# Patient Record
Sex: Female | Born: 1960
Health system: Southern US, Community
[De-identification: ages and names within clinical notes are randomized; demographics above are authoritative.]

## PROBLEM LIST (undated history)

## (undated) DIAGNOSIS — M1990 Unspecified osteoarthritis, unspecified site: Secondary | ICD-10-CM

## (undated) DIAGNOSIS — M8580 Other specified disorders of bone density and structure, unspecified site: Secondary | ICD-10-CM

## (undated) HISTORY — DX: Other specified disorders of bone density and structure, unspecified site: M85.80

## (undated) HISTORY — DX: Unspecified osteoarthritis, unspecified site: M19.90

## (undated) HISTORY — PX: DILATION AND CURETTAGE OF UTERUS: SHX78

---

## 1998-02-24 ENCOUNTER — Other Ambulatory Visit: Admission: RE | Admit: 1998-02-24 | Discharge: 1998-02-24 | Payer: Self-pay | Admitting: Gynecology

## 1999-04-18 ENCOUNTER — Other Ambulatory Visit: Admission: RE | Admit: 1999-04-18 | Discharge: 1999-04-18 | Payer: Self-pay | Admitting: Internal Medicine

## 2000-08-12 ENCOUNTER — Other Ambulatory Visit: Admission: RE | Admit: 2000-08-12 | Discharge: 2000-08-12 | Payer: Self-pay | Admitting: Gynecology

## 2001-11-16 ENCOUNTER — Other Ambulatory Visit: Admission: RE | Admit: 2001-11-16 | Discharge: 2001-11-16 | Payer: Self-pay | Admitting: Gynecology

## 2002-11-30 ENCOUNTER — Other Ambulatory Visit: Admission: RE | Admit: 2002-11-30 | Discharge: 2002-11-30 | Payer: Self-pay | Admitting: Gynecology

## 2002-11-30 ENCOUNTER — Ambulatory Visit (HOSPITAL_COMMUNITY): Admission: RE | Admit: 2002-11-30 | Discharge: 2002-11-30 | Payer: Self-pay | Admitting: Gynecology

## 2004-01-12 ENCOUNTER — Other Ambulatory Visit: Admission: RE | Admit: 2004-01-12 | Discharge: 2004-01-12 | Payer: Self-pay | Admitting: Gynecology

## 2005-01-16 ENCOUNTER — Other Ambulatory Visit: Admission: RE | Admit: 2005-01-16 | Discharge: 2005-01-16 | Payer: Self-pay | Admitting: Gynecology

## 2006-01-23 ENCOUNTER — Other Ambulatory Visit: Admission: RE | Admit: 2006-01-23 | Discharge: 2006-01-23 | Payer: Self-pay | Admitting: Gynecology

## 2007-02-20 ENCOUNTER — Other Ambulatory Visit: Admission: RE | Admit: 2007-02-20 | Discharge: 2007-02-20 | Payer: Self-pay | Admitting: Gynecology

## 2007-11-20 ENCOUNTER — Ambulatory Visit: Payer: Self-pay | Admitting: Gynecology

## 2007-11-24 ENCOUNTER — Ambulatory Visit: Payer: Self-pay | Admitting: Gynecology

## 2007-12-11 ENCOUNTER — Ambulatory Visit: Payer: Self-pay | Admitting: Gynecology

## 2007-12-15 ENCOUNTER — Ambulatory Visit: Payer: Self-pay | Admitting: Gynecology

## 2007-12-16 ENCOUNTER — Ambulatory Visit: Payer: Self-pay | Admitting: Gynecology

## 2007-12-16 ENCOUNTER — Ambulatory Visit (HOSPITAL_BASED_OUTPATIENT_CLINIC_OR_DEPARTMENT_OTHER): Admission: RE | Admit: 2007-12-16 | Discharge: 2007-12-16 | Payer: Self-pay | Admitting: Gynecology

## 2007-12-16 ENCOUNTER — Encounter: Payer: Self-pay | Admitting: Gynecology

## 2007-12-16 HISTORY — PX: OTHER SURGICAL HISTORY: SHX169

## 2007-12-24 ENCOUNTER — Ambulatory Visit: Payer: Self-pay | Admitting: Gynecology

## 2007-12-30 ENCOUNTER — Ambulatory Visit: Payer: Self-pay | Admitting: Gynecology

## 2008-04-26 ENCOUNTER — Encounter: Payer: Self-pay | Admitting: Gynecology

## 2008-04-26 ENCOUNTER — Ambulatory Visit: Payer: Self-pay | Admitting: Gynecology

## 2008-04-26 ENCOUNTER — Other Ambulatory Visit: Admission: RE | Admit: 2008-04-26 | Discharge: 2008-04-26 | Payer: Self-pay | Admitting: Gynecology

## 2008-05-10 ENCOUNTER — Ambulatory Visit: Payer: Self-pay | Admitting: Gynecology

## 2008-05-20 ENCOUNTER — Ambulatory Visit: Payer: Self-pay | Admitting: Gynecology

## 2008-05-25 ENCOUNTER — Ambulatory Visit: Payer: Self-pay | Admitting: Gynecology

## 2009-03-29 ENCOUNTER — Encounter: Admission: RE | Admit: 2009-03-29 | Discharge: 2009-03-29 | Payer: Self-pay | Admitting: Gynecology

## 2009-05-31 ENCOUNTER — Ambulatory Visit: Payer: Self-pay | Admitting: Gynecology

## 2009-05-31 ENCOUNTER — Other Ambulatory Visit: Admission: RE | Admit: 2009-05-31 | Discharge: 2009-05-31 | Payer: Self-pay | Admitting: Gynecology

## 2010-06-05 NOTE — Op Note (Signed)
[name], [name]              ACCOUNT NO.:  [id number]   MEDICAL RECORD NO.:  [id number]          PATIENT TYPE:  AMB   LOCATION:  [facility]                         FACILITY:  [facility]   PHYSICIAN:  Juan H. Lily Peer, M.D.DATE OF BIRTH:  1960-01-31   DATE OF PROCEDURE:  12/16/2007  DATE OF DISCHARGE:                               OPERATIVE REPORT   INDICATIONS FOR OPERATION:  A 50 year old gravida 4, para 3, AB1 with  dysfunctional uterine bleeding, workup gad consisted of sonohysterogram  demonstrated multiple endometrial polyps, endometrial biopsies otherwise  had been benign.   PREOPERATIVE DIAGNOSES:  1. Dysfunctional uterine bleeding.  2. Endometrial polyps.   POSTOPERATIVE DIAGNOSES:  1. Dysfunctional uterine bleeding.  2. Endometrial polyps.   ANESTHESIA:  General endotracheal anesthesia.   PROCEDURE:  Resectoscopic polypectomy.   FINDINGS:  Multiple endometrial polyps scattered throughout the  intrauterine cavity.  The cervical canal was clear.  Both tubal ostia  were identified.   DESCRIPTION OF OPERATION:  After the patient was adequately counseled  she was taken to the operating room where she underwent a successful  general endotracheal anesthesia.  She had a laminaria, had previously  been placed intracervically the day before the surgery which was  removed.  The cervix and vagina were prepped and draped in the usual  sterile fashion.  Single tooth tenaculum was placed on the anterior  cervical lip.  The uterus sounded to approximately 7 cm.  The cervix did  not require any dilatation.  The Lendell Caprice operative resectoscope with  a 90 degrees, wire loop was introduced into the intrauterine cavity.  The [facility] surgical generator was set at 30 on  coagulation and 70 on cut, 3% sorbitol was the distending media and the  entire endometrial cavity was inspected and multiple passes with the  operative resectoscope was required to remove the numerous  endometrial  polyps and was submitted for histological evaluation due to this small  bleeding, bleeders that were present throughout the cavity for  containment the rollerball was placed and these areas were cauterized to  contain the bleeding.  The patient tolerated the procedure well, single-  tooth tenaculum was removed.  Pre and post resectoscopic polypectomy  pictures were obtained, a set of copy can be kept at patient's hospital  record a second set will be kept in the office [facility].  The patient was awakened, extubated, and transferred to recovery with  stable vital signs.  Blood loss was minimal and not fluid resuscitation  consisted 900 mL of lactated Ringer's and urine  output preoperatively was 50 mL in and out __________ 3% sorbitol  distending media.  Fluid deficit was 200 mL and she did receive 1 gram  of cefoxitin for prophylaxis and had __________stockings at the knee  level for DVT prophylaxis.  The patient was awakened, transferred to  recovery in stable vital signs.      Juan H. Lily Peer, M.D.  Electronically Signed     JHF/MEDQ  D:  12/16/2007  T:  12/16/2007  Job:  [id number]

## 2010-06-05 NOTE — H&P (Signed)
[name], [name]              ACCOUNT NO.:  [id number]   MEDICAL RECORD NO.:  [id number]          PATIENT TYPE:  AMB   LOCATION:  [facility]                         [facility]ACILITY:  [facility]   PHYSICIAN:  Juan H. Lily Peer, M.D.DATE OF BIRTH:  [birth year]   DATE OF ADMISSION:  DATE OF DISCHARGE:                              HISTORY & PHYSICAL   CHIEF COMPLAINT:  1. Dysfunctional uterine bleeding.  2. Endometrial polyps.   HISTORY:  The patient is a 50 year old gravida 4, para 3, AB 1, whose  husband has had a vasectomy, was seen in the office on several occasions  this year complaining of dysfunctional uterine bleeding since October  this year.  Her workup had consisted of a normal TSH, prolactin.  She  was found to have what appeared to be iron deficiency anemia as a result  of her irregular bleeding.  She had had a CBC back in October 30  whereby, her hemoglobin was 8.4, hematocrit 27.3, and platelet count  378,000.  She is placed on iron supplementation twice a day and her iron  level on November 20 was as follows; hemoglobin 10.1, hematocrit 32.6,  and platelet count 332,000.  She had been placed on Megace 40 mg b.i.d.  to stop her bleeding.  Her workup had consisted of an endometrial biopsy  which was done on October 30 with a proliferative type endometrium.  No  evidence of hyperplasia or malignancy was identified, and she had had a  sonohysterogram on November 3 which demonstrated that she had 3  endometrial polyps in the uterus measuring 7 mm, 5 mm, and 14 mm x 7 mm.  She had a right atrophic ovary and left ovary had several follicles and  appears that may be she may have a small hydrosalpinx on the left tube.  The patient scheduled to undergo endometrial ablation, and she was seen  in the office for preoperative consultation on November 24 and had a  laminaria placed intracervically.   PAST MEDICAL HISTORY:  The patient denies any allergies.  She has had 3  normal spontaneous  vaginal delivery.  One missed AB resulting in a D and  C.  Her husband has had a vasectomy.  She takes calcium with vitamin D  for osteoporosis prevention.   FAMILY HISTORY:  Father with cardiovascular disease.  Mother with  osteoporosis and mother with thyroid disease.   PHYSICAL EXAMINATION:  VITAL SIGNS:  The patient weighs 156 pounds.  She  is 5 feet 5 inches tall.  Blood pressure 116/72.  HEENT:  Unremarkable.  NECK:  Supple.  Trachea midline.  No carotid bruits.  No thyromegaly.  LUNGS:  Clear to auscultation without rhonchi or wheezing.  HEART:  Regular rate and rhythm.  No murmurs or gallops.  BREASTS:  Exam done at time of her annual exam does show she was normal.  ABDOMEN:  Soft, nontender without rebound or guarding.  PELVIC:  Bartholin, urethra, Skene's within normal limits.  Vagina and  cervix no lesions or discharge.  Uterus anteverted, normal size, shape  and consistency.  Adnexa without any mass or tenderness.  RECTAL:  Deferred.   ASSESSMENT:  This 50 year old gravida 4, para 3, AB one ( husband with  vasectomy) with complaint of dysfunctional uterine bleeding and iron-  deficiency anemia.  The patient's hemoglobin improved on iron b.i.d. to  the most recent CBC done in the office on November 20, whereby her  hemoglobin was 10.1 and hematocrit 32.6.  Her TSH and prolactin have  been normal.  Normal endometrial biopsy and a sonohysterogram  demonstrated several intrauterine polyps.  The patient was seen in the  office on November 24.  A laminaria was placed intracervically.  She was  counseled of risks, benefits, and pros and cons of the operation to  include infection although she will receive prophylaxis antibiotic.  The  risk of uterine perforation was discussed as well and in the event of  hemorrhage, she would need any blood or blood products, potential risk  of anaphylactic reactions, hepatitis, and AIDS from donor blood with  discussed as well.  All questions  were answered, and we will follow  accordingly.   PLAN:  The patient was scheduled for resectoscope polypectomy on  Wednesday, November 25, at 8:30 a.m. in [facility].  Please have history and physical available.      Juan H. Lily Peer, M.D.  Electronically Signed     JHF/MEDQ  D:  12/15/2007  T:  12/16/2007  Job:  [id number]

## 2010-06-11 ENCOUNTER — Other Ambulatory Visit: Payer: Self-pay | Admitting: Gynecology

## 2010-06-11 DIAGNOSIS — Z1231 Encounter for screening mammogram for malignant neoplasm of breast: Secondary | ICD-10-CM

## 2010-06-25 ENCOUNTER — Ambulatory Visit
Admission: RE | Admit: 2010-06-25 | Discharge: 2010-06-25 | Disposition: A | Discharge status: Home / Self Care | Payer: 59 | Source: Ambulatory Visit | Attending: Gynecology | Admitting: Gynecology

## 2010-06-25 DIAGNOSIS — Z1231 Encounter for screening mammogram for malignant neoplasm of breast: Secondary | ICD-10-CM

## 2010-10-31 ENCOUNTER — Encounter: Payer: 59 | Admitting: Gynecology

## 2010-11-05 ENCOUNTER — Encounter: Payer: 59 | Admitting: Gynecology

## 2010-11-26 ENCOUNTER — Encounter: Payer: Self-pay | Admitting: Gynecology

## 2010-11-26 ENCOUNTER — Ambulatory Visit (INDEPENDENT_AMBULATORY_CARE_PROVIDER_SITE_OTHER): Payer: 59 | Admitting: Gynecology

## 2010-11-26 ENCOUNTER — Other Ambulatory Visit (HOSPITAL_COMMUNITY)
Admission: RE | Admit: 2010-11-26 | Discharge: 2010-11-26 | Disposition: A | Discharge status: Home / Self Care | Payer: 59 | Source: Ambulatory Visit | Attending: Gynecology | Admitting: Gynecology

## 2010-11-26 VITALS — BP 116/70 | Ht 64.5 in | Wt 161.0 lb

## 2010-11-26 DIAGNOSIS — Z01419 Encounter for gynecological examination (general) (routine) without abnormal findings: Secondary | ICD-10-CM | POA: Insufficient documentation

## 2010-11-26 DIAGNOSIS — R82998 Other abnormal findings in urine: Secondary | ICD-10-CM

## 2010-11-26 DIAGNOSIS — N951 Menopausal and female climacteric states: Secondary | ICD-10-CM

## 2010-11-26 DIAGNOSIS — R635 Abnormal weight gain: Secondary | ICD-10-CM

## 2010-11-26 NOTE — Progress Notes (Signed)
[name] 10-Jan-1961 [id number]   History:    50 y.o.  for annual exam with complaints of vasomotor symptoms consisting of hot flashes irritability and mood swing and vaginal dryness. Patient's husband has had a vasectomy. Patient's last mammogram was in May of this year which was normal. Review of her record indicates she was weighing 166 down to 161. Back in 2008 she had a bone density study as a result of the history of stress fracture and family history of osteoporosis and she had normal bone mineralization.  Past medical history,surgical history, family history and social history were all reviewed and documented in the EPIC chart.  Gynecologic History Patient's last menstrual period was 05/01/2010. Contraception: vasectomy Last Pap: 2011. Results were: normal Last mammogram: 2012. Results were: normal  Obstetric History OB History    Grav Para Term Preterm Abortions TAB SAB Ect Mult Living   4 3 3  1  1   3      # Outc Date GA Lbr Len/2nd Wgt Sex Del Anes PTL Lv   1 TRM     F SVD   Yes   2 TRM     F SVD   Yes   3 TRM     M SVD   Yes   4 SAB                ROS:  Was performed and pertinent positives and negatives are included in the history.  Exam: chaperone present  BP 116/70  Ht 5' 4.5" (1.638 m)  Wt 161 lb (73.029 kg)  BMI 27.21 kg/m2  LMP 05/01/2010  Body mass index is 27.21 kg/(m^2).  General appearance : Well developed well nourished female. No acute distress HEENT: Neck supple, trachea midline, no carotid bruits, no thyroidmegaly Lungs: Clear to auscultation, no rhonchi or wheezes, or rib retractions  Heart: Regular rate and rhythm, no murmurs or gallops Breast:Examined in sitting and supine position were symmetrical in appearance, no palpable masses or tenderness,  no skin retraction, no nipple inversion, no nipple discharge, no skin discoloration, no axillary or supraclavicular lymphadenopathy Abdomen: no palpable masses or tenderness, no rebound or  guarding Extremities: no edema or skin discoloration or tenderness  Pelvic:  Bartholin, Urethra, Skene Glands: Within normal limits             Vagina: No gross lesions or discharge  Cervix: No gross lesions or discharge  Uterus  anteverted, normal size, shape and consistency, non-tender and mobile  Adnexa  Without masses or tenderness  Anus and perineum  normal   Rectovaginal  normal sphincter tone without palpated masses or tenderness             Hemoccult not done     Assessment/Plan:  50 y.o. female for annual exam with climacteric like symptoms consisting of hot flashes irritability and mood swings and vaginal dryness. She has had 2 menstrual periods in the past 2 years. She was encouraged to do her monthly self breast examination. We went over the course of the appropriate amount of calcium and vitamin D for osteoporosis prevention. Also recommend weightbearing exercises we 5 minutes 3-4 times a week. We will plan on doing a bone density study next year. She'll return back to the office next week to discuss the following lab results: Low sugar, TSH, CBC, total cholesterol, UA, and Pap smear. Literature information on the menopause was provided as well as on hormone replacement therapy which we will discuss six-week as well as a  result of her FSH. Patient received her flu vaccine October of this year.    Ok Edwards MD, 10:43 AM 11/26/2010

## 2010-11-26 NOTE — Patient Instructions (Signed)
Total Calcium recommended is 1200-1500mg /day and of vitamin D should be 2000 units/day

## 2010-12-04 ENCOUNTER — Ambulatory Visit (INDEPENDENT_AMBULATORY_CARE_PROVIDER_SITE_OTHER): Payer: 59 | Admitting: Gynecology

## 2010-12-04 ENCOUNTER — Encounter: Payer: Self-pay | Admitting: Gynecology

## 2010-12-04 VITALS — BP 120/74

## 2010-12-04 DIAGNOSIS — Z780 Asymptomatic menopausal state: Secondary | ICD-10-CM

## 2010-12-04 DIAGNOSIS — N951 Menopausal and female climacteric states: Secondary | ICD-10-CM

## 2010-12-04 MED ORDER — PROGESTERONE MICRONIZED 200 MG PO CAPS: 200.0000 mg | ORAL_CAPSULE | Freq: Every day | ORAL | Status: DC

## 2010-12-04 MED ORDER — ESTRADIOL 0.52 MG/0.87 GM (0.06%) TD GEL: 1.0000 "application " | Freq: Every day | TRANSDERMAL | Status: DC

## 2010-12-04 NOTE — Patient Instructions (Signed)
NAMS Financial planner Menopausal Society)   [email or web address]

## 2010-12-04 NOTE — Progress Notes (Signed)
Patient is a 50 year old who was seen in the office for her annual gynecological examination on November 5 with vasomotor symptoms consisting of hot flashes, ureter bili, mood swings, and vaginal dryness. She returned to the office today to discuss lab results and plan a course of management. Lab results demonstrated a negative Pap smear and normal CBC, urinalysis, cholesterol, and blood sugar, TSH. Her FSH was found to be elevated at 46 (menopausal range)  Patient has had only 2 periods in the past year. We had a lengthy discussion of the women's health initiative study as to the risk of hormone replacement therapy. The risks benefits and pros and cons were discussed to include the risk of DVT as well as the risk of breast cancer and other issues. Patient previously was provided literature formation on hormone replacement therapy and on the menopause. She had read the literature formation was provided all questions were discussed today. We discussed about starting with the lowest dose and plan on 5-6 years and then begin to taper her off. And due to the fact that she has the uterus and we will need to place her on a progestational agent but cyclically.  We discussed oral versus transdermal rounds and she would like to proceed with a transdermal route we'll start her on elestrin 0.06% to apply one pump to one arm only daily with the addition of Prometrium 200 mg for the first 12 days of each month.  Patient fully understands the risks benefits and pros and cons of hormone replacement therapy and accepts and she will we'll start on the above recommendation.. If she has any form of irregular bleeding she knows to contact the office whereby she will need to be scheduled for an endometrial biopsy.

## 2011-04-22 ENCOUNTER — Other Ambulatory Visit: Payer: Self-pay | Admitting: *Deleted

## 2011-04-22 MED ORDER — ESTRADIOL 0.52 MG/0.87 GM (0.06%) TD GEL: 1.0000 "application " | Freq: Every day | TRANSDERMAL | Status: DC

## 2011-10-17 ENCOUNTER — Other Ambulatory Visit: Payer: Self-pay | Admitting: Gynecology

## 2011-10-17 DIAGNOSIS — Z1231 Encounter for screening mammogram for malignant neoplasm of breast: Secondary | ICD-10-CM

## 2011-10-24 ENCOUNTER — Ambulatory Visit
Admission: RE | Admit: 2011-10-24 | Discharge: 2011-10-24 | Disposition: A | Discharge status: Home / Self Care | Payer: 59 | Source: Ambulatory Visit | Attending: Gynecology | Admitting: Gynecology

## 2011-10-24 DIAGNOSIS — Z1231 Encounter for screening mammogram for malignant neoplasm of breast: Secondary | ICD-10-CM

## 2011-12-16 ENCOUNTER — Other Ambulatory Visit: Payer: Self-pay | Admitting: Gynecology

## 2011-12-16 ENCOUNTER — Encounter: Payer: Self-pay | Admitting: Gynecology

## 2011-12-16 ENCOUNTER — Ambulatory Visit (INDEPENDENT_AMBULATORY_CARE_PROVIDER_SITE_OTHER): Payer: 59 | Admitting: Gynecology

## 2011-12-16 VITALS — BP 120/80 | Ht 64.5 in | Wt 157.0 lb

## 2011-12-16 DIAGNOSIS — Z79890 Hormone replacement therapy (postmenopausal): Secondary | ICD-10-CM

## 2011-12-16 DIAGNOSIS — N951 Menopausal and female climacteric states: Secondary | ICD-10-CM

## 2011-12-16 DIAGNOSIS — Z01419 Encounter for gynecological examination (general) (routine) without abnormal findings: Secondary | ICD-10-CM

## 2011-12-16 LAB — GLUCOSE, RANDOM: Glucose, Bld: 83 mg/dL (ref 70–99)

## 2011-12-16 LAB — CBC WITH DIFFERENTIAL/PLATELET
Basophils Relative: 0 % (ref 0–1)
Eosinophils Absolute: 0 10*3/uL (ref 0.0–0.7)
Eosinophils Relative: 0 % (ref 0–5)
HCT: 38 % (ref 36.0–46.0)
Lymphocytes Relative: 41 % (ref 12–46)
MCH: 32.2 pg (ref 26.0–34.0)
MCHC: 33.7 g/dL (ref 30.0–36.0)
MCV: 95.7 fL (ref 78.0–100.0)
Monocytes Absolute: 0.4 10*3/uL (ref 0.1–1.0)
Neutrophils Relative %: 51 % (ref 43–77)
RDW: 13.6 % (ref 11.5–15.5)

## 2011-12-16 LAB — CHOLESTEROL, TOTAL: Cholesterol: 195 mg/dL (ref 0–200)

## 2011-12-16 LAB — TSH: TSH: 0.7 u[IU]/mL (ref 0.350–4.500)

## 2011-12-16 NOTE — Patient Instructions (Addendum)
Breast Self-Awareness Practicing breast self-awareness may pick up problems early, prevent significant medical complications, and possibly save your life. By practicing breast self-awareness, you can become familiar with how your breasts look and feel and if your breasts are changing. This allows you to notice changes early. It can also offer you some reassurance that your breast health is good. One way to learn what is normal for your breasts and whether your breasts are changing is to do a breast self-exam. If you find a lump or something that was not present in the past, it is best to contact your caregiver right away. Other findings that should be evaluated by your caregiver include nipple discharge, especially if it is bloody; skin changes or reddening; areas where the skin seems to be pulled in (retracted); or new lumps and bumps. Breast pain is seldom associated with cancer (malignancy), but should also be evaluated by a caregiver. BREAST SELF-EXAM The best time to examine your breasts is 5 7 days after your menstrual period is over. During menstruation, the breasts are lumpier, and it may be more difficult to pick up changes. If you do not menstruate, have reached menopause, or had your uterus removed (hysterectomy), you should examine your breasts at regular intervals, such as monthly. If you are breastfeeding, examine your breasts after a feeding or after using a breast pump. Breast implants do not decrease the risk for lumps or tumors, so continue to perform breast self-exams as recommended. Talk to your caregiver about how to determine the difference between the implant and breast tissue. Also, talk about the amount of pressure you should use during the exam. Over time, you will become more familiar with the variations of your breasts and more comfortable with the exam. A breast self-exam requires you to remove all your clothes above the waist.   Look at your breasts and nipples. Stand in front of  a mirror in a room with good lighting. With your hands on your hips, push your hands firmly downward. Look for a difference in shape, contour, and size from one breast to the other (asymmetry). Asymmetry includes puckers, dips, or bumps. Also, look for skin changes, such as reddened or scaly areas on the breasts. Look for nipple changes, such as discharge, dimpling, repositioning, or redness.  Carefully feel your breasts. This is best done either in the shower or tub while using soapy water or when flat on your back. Place the arm (on the side of the breast you are examining) above your head. Use the pads (not the fingertips) of your three middle fingers on your opposite hand to feel your breasts. Start in the underarm area and use  inch (2 cm) overlapping circles to feel your breast. Use 3 different levels of pressure (light, medium, and firm pressure) at each circle before moving to the next circle. The light pressure is needed to feel the tissue closest to the skin. The medium pressure will help to feel breast tissue a little deeper, while the firm pressure is needed to feel the tissue close to the ribs. Continue the overlapping circles, moving downward over the breast until you feel your ribs below your breast. Then, move one finger-width towards the center of the body. Continue to use the  inch (2 cm) overlapping circles to feel your breast as you move slowly up toward the collar bone (clavicle) near the base of the neck. Continue the up and down exam using all 3 pressures until you reach the middle of   the chest. Do this with each breast, carefully feeling for lumps or changes.  Keep a written record with breast changes or normal findings for each breast. By writing this information down, you do not need to depend only on memory for size, tenderness, or location. Write down where you are in your menstrual cycle, if you are still menstruating.  Breast tissue can have some lumps or thick tissue. However,  see your caregiver if you find anything that concerns you.  SEEK MEDICAL CARE IF:  You see a change in shape, contour, or size of your breasts or nipples.   You see skin changes, such as reddened or scaly areas on the breasts or nipples.   You have an unusual discharge from your nipples.   You feel a new lump or unusually thick areas.  Document Released: 01/07/2005 Document Revised: 07/09/2011 Document Reviewed: 04/24/2011 ExitCare Patient Information 2013 [employer].   Tetanus, Diphtheria, Pertussis (Tdap) Vaccine What You Need to Know WHY GET VACCINATED? Tetanus, diphtheria and pertussis can be very serious diseases, even for adolescents and adults. Tdap vaccine can protect us from these diseases. TETANUS (Lockjaw) causes painful muscle tightening and stiffness, usually all over the body.  It can lead to tightening of muscles in the head and neck so you can't open your mouth, swallow, or sometimes even breathe. Tetanus kills about 1 out of 5 people who are infected. DIPHTHERIA can cause a thick coating to form in the back of the throat.  It can lead to breathing problems, paralysis, heart failure, and death. PERTUSSIS (Whooping Cough) causes severe coughing spells, which can cause difficulty breathing, vomiting and disturbed sleep.  It can also lead to weight loss, incontinence, and rib fractures. Up to 2 in 100 adolescents and 5 in 100 adults with pertussis are hospitalized or have complications, which could include pneumonia and death. These diseases are caused by bacteria. Diphtheria and pertussis are spread from person to person through coughing or sneezing. Tetanus enters the body through cuts, scratches, or wounds. Before vaccines, the [place] saw as many as 200,000 cases a year of diphtheria and pertussis, and hundreds of cases of tetanus. Since vaccination began, tetanus and diphtheria have dropped by about 99% and pertussis by about 80%. TDAP VACCINE Tdap  vaccine can protect adolescents and adults from tetanus, diphtheria, and pertussis. One dose of Tdap is routinely given at age 11 or 12. People who did not get Tdap at that age should get it as soon as possible. Tdap is especially important for health care professionals and anyone having close contact with a baby younger than 12 months. Pregnant women should get a dose of Tdap during every pregnancy, to protect the newborn from pertussis. Infants are most at risk for severe, life-threatening complications from pertussis. A similar vaccine, called Td, protects from tetanus and diphtheria, but not pertussis. A Td booster should be given every 10 years. Tdap may be given as one of these boosters if you have not already gotten a dose. Tdap may also be given after a severe cut or burn to prevent tetanus infection. Your doctor can give you more information. Tdap may safely be given at the same time as other vaccines. SOME PEOPLE SHOULD NOT GET THIS VACCINE  If you ever had a life-threatening allergic reaction after a dose of any tetanus, diphtheria, or pertussis containing vaccine, OR if you have a severe allergy to any part of this vaccine, you should not get Tdap. Tell your doctor if you   have any severe allergies.  If you had a coma, or long or multiple seizures within 7 days after a childhood dose of DTP or DTaP, you should not get Tdap, unless a cause other than the vaccine was found. You can still get Td.  Talk to your doctor if you:  have epilepsy or another nervous system problem,  had severe pain or swelling after any vaccine containing diphtheria, tetanus or pertussis,  ever had Guillain-Barr Syndrome (GBS),  aren't feeling well on the day the shot is scheduled. RISKS OF A VACCINE REACTION With any medicine, including vaccines, there is a chance of side effects. These are usually mild and go away on their own, but serious reactions are also possible. Brief fainting spells can follow a  vaccination, leading to injuries from falling. Sitting or lying down for about 15 minutes can help prevent these. Tell your doctor if you feel dizzy or light-headed, or have vision changes or ringing in the ears. Mild problems following Tdap (Did not interfere with activities)  Pain where the shot was given (about 3 in 4 adolescents or 2 in 3 adults)  Redness or swelling where the shot was given (about 1 person in 5)  Mild fever of at least 100.4F (up to about 1 in 25 adolescents or 1 in 100 adults)  Headache (about 3 or 4 people in 10)  Tiredness (about 1 person in 3 or 4)  Nausea, vomiting, diarrhea, stomach ache (up to 1 in 4 adolescents or 1 in 10 adults)  Chills, body aches, sore joints, rash, swollen glands (uncommon) Moderate problems following Tdap (Interfered with activities, but did not require medical attention)  Pain where the shot was given (about 1 in 5 adolescents or 1 in 100 adults)  Redness or swelling where the shot was given (up to about 1 in 16 adolescents or 1 in 25 adults)  Fever over 102F (about 1 in 100 adolescents or 1 in 250 adults)  Headache (about 3 in 20 adolescents or 1 in 10 adults)  Nausea, vomiting, diarrhea, stomach ache (up to 1 or 3 people in 100)  Swelling of the entire arm where the shot was given (up to about 3 in 100). Severe problems following Tdap (Unable to perform usual activities, required medical attention)  Swelling, severe pain, bleeding and redness in the arm where the shot was given (rare). A severe allergic reaction could occur after any vaccine (estimated less than 1 in a million doses). WHAT IF THERE IS A SERIOUS REACTION? What should I look for?  Look for anything that concerns you, such as signs of a severe allergic reaction, very high fever, or behavior changes. Signs of a severe allergic reaction can include hives, swelling of the face and throat, difficulty breathing, a fast heartbeat, dizziness, and weakness. These  would start a few minutes to a few hours after the vaccination. What should I do?  If you think it is a severe allergic reaction or other emergency that can't wait, call 9-1-1 or get the person to the nearest hospital. Otherwise, call your doctor.  Afterward, the reaction should be reported to the "Vaccine Adverse Event Reporting System" (VAERS). Your doctor might file this report, or you can do it yourself through the VAERS web site at www.vaers.hhs.gov, or by calling 1-800-822-7967. VAERS is only for reporting reactions. They do not give medical advice.  THE NATIONAL VACCINE INJURY COMPENSATION PROGRAM The National Vaccine Injury Compensation Program (VICP) is a federal program that was created to   compensate people who may have been injured by certain vaccines. Persons who believe they may have been injured by a vaccine can learn about the program and about filing a claim by calling 1-800-338-2382 or visiting the VICP website at www.hrsa.gov/vaccinecompensation. HOW CAN I LEARN MORE?  Ask your doctor.  Call your local or state health department.  Contact the Centers for Disease Control and Prevention (CDC):  Call 1-800-232-4636 or visit CDC's website at www.cdc.gov/vaccines CDC Tdap Vaccine VIS (05/30/11) Document Released: 07/09/2011 Document Reviewed: 07/09/2011 ExitCare Patient Information 2013 [employer].   

## 2011-12-16 NOTE — Progress Notes (Signed)
[name] 1960-07-24 [id number]   History:    51 y.o.  for annual gyn exam with no complaints. Her husband has had a vasectomy. She was diagnosed been menopausal last year. She was started on elestrin 0.06% which she applies transdermally to one arm each bedtime with the addition of Prometrium 200 mg for 10 days of the month. She denies any vaginal bleeding and her vasomotor symptoms have improved significantly. She infrequent does her self breast examination. Her last mammogram was October this year which was normal. Her last Pap smear was normal in 2012. No prior history of abnormal Pap smears. She is taking her calcium and vitamin D. Her mother and grandmother both had osteoporosis. Patient's last bone density study was here in our office in 2008 which was normal and was done because also patient has had history of stress fractures in the past.  Past medical history,surgical history, family history and social history were all reviewed and documented in the EPIC chart.  Gynecologic History No LMP recorded. Patient is not currently having periods (Reason: Perimenopausal). Contraception: post menopausal status Last Pap: 2012. Results were: normal Last mammogram: 2013. Results were: normal  Obstetric History OB History    Grav Para Term Preterm Abortions TAB SAB Ect Mult Living   4 3 3  1  1   3      # Outc Date GA Lbr Len/2nd Wgt Sex Del Anes PTL Lv   1 TRM     F SVD   Yes   2 TRM     F SVD   Yes   3 TRM     M SVD   Yes   4 SAB                ROS: A ROS was performed and pertinent positives and negatives are included in the history.  GENERAL: No fevers or chills. HEENT: No change in vision, no earache, sore throat or sinus congestion. She was noted to have a pigmented lesion on her bottom lip. Also the right maxillary bone laterally there appears to be a raised pigmented area possibly senile keratosis. NECK: No pain or stiffness. CARDIOVASCULAR: No chest pain or pressure. No  palpitations. PULMONARY: No shortness of breath, cough or wheeze. GASTROINTESTINAL: No abdominal pain, nausea, vomiting or diarrhea, melena or bright red blood per rectum. GENITOURINARY: No urinary frequency, urgency, hesitancy or dysuria. MUSCULOSKELETAL: No joint or muscle pain, no back pain, no recent trauma. DERMATOLOGIC: No rash, no itching, no lesions. ENDOCRINE: No polyuria, polydipsia, no heat or cold intolerance. No recent change in weight. HEMATOLOGICAL: No anemia or easy bruising or bleeding. NEUROLOGIC: No headache, seizures, numbness, tingling or weakness. PSYCHIATRIC: No depression, no loss of interest in normal activity or change in sleep pattern.     Exam: chaperone present  BP 120/80  Ht 5' 4.5" (1.638 m)  Wt 157 lb (71.215 kg)  BMI 26.53 kg/m2  Body mass index is 26.53 kg/(m^2).  General appearance : Well developed well nourished female. No acute distress HEENT: Neck supple, trachea midline, no carotid bruits, no thyroidmegaly Lungs: Clear to auscultation, no rhonchi or wheezes, or rib retractions  Heart: Regular rate and rhythm, no murmurs or gallops Breast:Examined in sitting and supine position were symmetrical in appearance, no palpable masses or tenderness,  no skin retraction, no nipple inversion, no nipple discharge, no skin discoloration, no axillary or supraclavicular lymphadenopathy Abdomen: no palpable masses or tenderness, no rebound or guarding Extremities: no edema or skin discoloration or  tenderness  Pelvic:  Bartholin, Urethra, Skene Glands: Within normal limits             Vagina: No gross lesions or discharge  Cervix: No gross lesions or discharge  Uterus  anteverted, normal size, shape and consistency, non-tender and mobile  Adnexa  Without masses or tenderness  Anus and perineum  normal   Rectovaginal  normal sphincter tone without palpated masses or tenderness             Hemoccult cards provided     Assessment/Plan:  51 y.o. female for annual  exam doing well on hormone replacement therapy. Who was noted to have a pigmented lesion flat on her bottom lip. Also right lateral face maxillary region there appears to be a crust like pigmented area possibly senile keratosis. I have recommended that the patient follow with dermatologist for further mole check and evaluation. I've given her the name of local dermatologist. She will also need to schedule a screening colonoscopy. Literature information on Tdap was provided. She was instructed to continue to take her calcium and vitamin D along with exercise at least 45 minutes 3-4 times a week. She will schedule a bone density study next few weeks. The following labs were ordered: Cholesterol, CBC, urinalysis, TSH, and random blood sugar. No Pap smear done today new guidelines discussed. Hemoccult cards provided for her to see me to the office later.    Ok Edwards MD, 12:11 PM 12/16/2011

## 2011-12-17 LAB — URINALYSIS W MICROSCOPIC + REFLEX CULTURE
Bacteria, UA: NONE SEEN
Bilirubin Urine: NEGATIVE
Casts: NONE SEEN
Crystals: NONE SEEN
Glucose, UA: NEGATIVE mg/dL
Hgb urine dipstick: NEGATIVE
Ketones, ur: NEGATIVE mg/dL
Leukocytes, UA: NEGATIVE
Nitrite: NEGATIVE
Protein, ur: NEGATIVE mg/dL
Specific Gravity, Urine: 1.015 (ref 1.005–1.030)
Urobilinogen, UA: 0.2 mg/dL (ref 0.0–1.0)
pH: 6 (ref 5.0–8.0)

## 2011-12-23 ENCOUNTER — Encounter: Payer: Self-pay | Admitting: Gynecology

## 2011-12-23 ENCOUNTER — Other Ambulatory Visit: Payer: Self-pay | Admitting: *Deleted

## 2011-12-23 MED ORDER — PROGESTERONE MICRONIZED 200 MG PO CAPS: 200.0000 mg | ORAL_CAPSULE | Freq: Every day | ORAL | Status: DC

## 2011-12-24 ENCOUNTER — Encounter: Payer: Self-pay | Admitting: Gynecology

## 2012-02-04 ENCOUNTER — Ambulatory Visit (INDEPENDENT_AMBULATORY_CARE_PROVIDER_SITE_OTHER): Payer: 59

## 2012-02-04 ENCOUNTER — Encounter: Payer: Self-pay | Admitting: Gynecology

## 2012-02-04 DIAGNOSIS — N951 Menopausal and female climacteric states: Secondary | ICD-10-CM

## 2012-02-04 DIAGNOSIS — M8580 Other specified disorders of bone density and structure, unspecified site: Secondary | ICD-10-CM

## 2012-02-04 DIAGNOSIS — M949 Disorder of cartilage, unspecified: Secondary | ICD-10-CM

## 2012-02-06 ENCOUNTER — Other Ambulatory Visit: Payer: Self-pay | Admitting: *Deleted

## 2012-02-06 ENCOUNTER — Other Ambulatory Visit: Payer: 59

## 2012-02-06 DIAGNOSIS — M8580 Other specified disorders of bone density and structure, unspecified site: Secondary | ICD-10-CM

## 2012-02-07 LAB — PTH, INTACT AND CALCIUM: Calcium, Total (PTH): 9.6 mg/dL (ref 8.4–10.5)

## 2012-02-13 ENCOUNTER — Encounter: Payer: Self-pay | Admitting: Gynecology

## 2012-02-13 ENCOUNTER — Ambulatory Visit (INDEPENDENT_AMBULATORY_CARE_PROVIDER_SITE_OTHER): Payer: 59 | Admitting: Gynecology

## 2012-02-13 VITALS — BP 110/70

## 2012-02-13 DIAGNOSIS — Z780 Asymptomatic menopausal state: Secondary | ICD-10-CM

## 2012-02-13 DIAGNOSIS — M899 Disorder of bone, unspecified: Secondary | ICD-10-CM

## 2012-02-13 DIAGNOSIS — M8580 Other specified disorders of bone density and structure, unspecified site: Secondary | ICD-10-CM

## 2012-02-13 DIAGNOSIS — M949 Disorder of cartilage, unspecified: Secondary | ICD-10-CM

## 2012-02-13 NOTE — Progress Notes (Signed)
Patient presented to the office today for to discuss her labs as well as her bone density study. She was in the office on 12/16/2011. Patient with history of ankle stress fracture at the age of 61 and both mother and aunt with history of osteoporosis. Patient was recently started on elestrin 0.06% transdermal for menopausal symptoms and is doing well and reports no vaginal bleeding. She takes Prometrium 200 mg for 12 days of the month. Her recent labs as follows:  Blood sugar, screening cholesterol, CBC, TSH, urinalysis, PTH, vitamin D, and calcium were all normal.  Bone density study: Demonstrated decreased bone mineralization at the AP spine with a T score of -1.6 statistically significant decrease in bone mineralization of 10% from previous scan in 2008.  A detailed discussion of the above findings was discussed with the patient. She will continue on her hormone replacement therapy which was started less than a year ago. It was recommended that she continue calcium 1200 mg daily with vitamin D 2000 units daily. We discussed importance of weightbearing exercises 45 minutes 3 or 4 times a week. We'll follow with a bone density study in 2 years. Patient's risk factor for osteoporosis are: Postmenopausal, family history of osteoporosis, history of stress fracture of the ankle age 52.

## 2012-03-07 ENCOUNTER — Other Ambulatory Visit: Payer: Self-pay

## 2012-05-21 ENCOUNTER — Other Ambulatory Visit: Payer: Self-pay | Admitting: Obstetrics and Gynecology

## 2012-06-10 ENCOUNTER — Telehealth: Payer: Self-pay | Admitting: *Deleted

## 2012-06-10 NOTE — Telephone Encounter (Signed)
This may be just a normal withdrawal bleed but I would like for her to come to the office this week or next week to do an endometrial biopsy for reassurance.

## 2012-06-10 NOTE — Telephone Encounter (Signed)
Pt called c/o bleeding while on HRT taking elestrin 0.06% transdermal & Prometrium 200 mg for 12 days of the month. Pt said she started bleeding this afternoon, period flow, pt said no cycle since 2012. Please advise

## 2012-06-10 NOTE — Telephone Encounter (Signed)
Left message on pt voicemail to make ov with JF.

## 2012-06-11 ENCOUNTER — Encounter: Payer: Self-pay | Admitting: Gynecology

## 2012-06-11 ENCOUNTER — Ambulatory Visit (INDEPENDENT_AMBULATORY_CARE_PROVIDER_SITE_OTHER): Payer: 59 | Admitting: Gynecology

## 2012-06-11 VITALS — BP 128/82

## 2012-06-11 DIAGNOSIS — N950 Postmenopausal bleeding: Secondary | ICD-10-CM

## 2012-06-11 NOTE — Progress Notes (Signed)
Patient is a 52 year old who presented to the office today complaining of episode of unusual vaginal bleeding. Patient was started on elestrin 0.06% transdermally to one arm each bedtime with the addition of Prometrium 200 mg for 10 days of the month (started in 2012). Patient not had any form of vaginal bleeding and a few hours before she was to start her Prometrium she began bleeding like a period. Patient denied any cramping, dysuria, frequency, or back pain or any bloating. Review of patient's records indicated that in 2009 she had resectoscopic polypectomy for benign endometrial polyp.  Exam: Abdomen: Soft nontender no rebound or guarding Pelvic: Bartholin urethra Skene was within normal limits Vagina: Some dark red blood was noted in the vaginal vault Cervix: No active bleeding noted Uterus: Anteverted normal size shape and consistency Adnexa: No palpable masses or tenderness Rectal exam: Not done  Procedure note: The cervix was cleansed with Betadine solution. A single-tooth tenaculum was placed on the anterior cervical lip a sterile Pipelle was introduced into the uterine cavity with a measurement of 7-1/2 cm. Moderate amount of tissue was obtained and was submitted for histological evaluation.  Assessment/plan: Unusual postmenopausal bleeding on cyclical hormone replacement therapy. Patient with prior history of endometrial polyp in 2009 resected and was benign. Endometrial biopsy done today resulting in time of this dictation. Patient will continue on her Prometrium but not start the elestrin and tilt 10 days from now. We'll wait for the pathology report. Patient will return back to the office in one to 2 weeks for sonohysterogram.

## 2012-06-11 NOTE — Patient Instructions (Addendum)
Endometrial Biopsy This is a test in which a tissue sample (a biopsy) is taken from inside the uterus (womb). It is then looked at by a specialist under a microscope to see if the tissue is normal or abnormal. The endometrium is the lining of the uterus. This test helps determine where you are in your menstrual cycle and how hormone levels are affecting the lining of the uterus. Another use for this test is to diagnose endometrial cancer, tuberculosis, polyps, or inflammatory conditions and to evaluate uterine bleeding. PREPARATION FOR TEST No preparation or fasting is necessary. NORMAL FINDINGS No pathologic conditions. Presence of "secretory-type" endometrium 3 to 5 days before to normal menstruation. Ranges for normal findings may vary among different laboratories and hospitals. You should always check with your doctor after having lab work or other tests done to discuss the meaning of your test results and whether your values are considered within normal limits. MEANING OF TEST  Your caregiver will go over the test results with you and discuss the importance and meaning of your results, as well as treatment options and the need for additional tests if necessary. OBTAINING THE TEST RESULTS It is your responsibility to obtain your test results. Ask the lab or department performing the test when and how you will get your results. Document Released: 05/10/2004 Document Revised: 04/01/2011 Document Reviewed: 12/18/2007 ExitCare Patient Information 2014 [employer].  

## 2012-06-24 ENCOUNTER — Other Ambulatory Visit: Payer: Self-pay | Admitting: Gynecology

## 2012-06-24 DIAGNOSIS — N83339 Acquired atrophy of ovary and fallopian tube, unspecified side: Secondary | ICD-10-CM

## 2012-07-06 ENCOUNTER — Ambulatory Visit: Payer: 59 | Admitting: Gynecology

## 2012-07-06 ENCOUNTER — Other Ambulatory Visit: Payer: 59

## 2012-11-10 ENCOUNTER — Other Ambulatory Visit: Payer: Self-pay

## 2012-11-10 DIAGNOSIS — Z1231 Encounter for screening mammogram for malignant neoplasm of breast: Secondary | ICD-10-CM

## 2012-11-26 ENCOUNTER — Other Ambulatory Visit: Payer: Self-pay

## 2012-11-26 ENCOUNTER — Ambulatory Visit
Admission: RE | Admit: 2012-11-26 | Discharge: 2012-11-26 | Disposition: A | Discharge status: Home / Self Care | Payer: 59 | Source: Ambulatory Visit

## 2012-11-26 DIAGNOSIS — Z1231 Encounter for screening mammogram for malignant neoplasm of breast: Secondary | ICD-10-CM

## 2012-12-24 ENCOUNTER — Ambulatory Visit (INDEPENDENT_AMBULATORY_CARE_PROVIDER_SITE_OTHER): Payer: 59 | Admitting: Gynecology

## 2012-12-24 ENCOUNTER — Encounter: Payer: Self-pay | Admitting: Gynecology

## 2012-12-24 VITALS — BP 126/78 | Ht 65.0 in | Wt 166.8 lb

## 2012-12-24 DIAGNOSIS — Z1159 Encounter for screening for other viral diseases: Secondary | ICD-10-CM

## 2012-12-24 DIAGNOSIS — N951 Menopausal and female climacteric states: Secondary | ICD-10-CM

## 2012-12-24 DIAGNOSIS — Z780 Asymptomatic menopausal state: Secondary | ICD-10-CM

## 2012-12-24 DIAGNOSIS — Z01419 Encounter for gynecological examination (general) (routine) without abnormal findings: Secondary | ICD-10-CM

## 2012-12-24 DIAGNOSIS — Z79890 Hormone replacement therapy (postmenopausal): Secondary | ICD-10-CM

## 2012-12-24 DIAGNOSIS — M8580 Other specified disorders of bone density and structure, unspecified site: Secondary | ICD-10-CM | POA: Insufficient documentation

## 2012-12-24 DIAGNOSIS — M899 Disorder of bone, unspecified: Secondary | ICD-10-CM

## 2012-12-24 MED ORDER — ESTRADIOL 0.52 MG/0.87 GM (0.06%) TD GEL: TRANSDERMAL | Status: DC

## 2012-12-24 MED ORDER — PROGESTERONE MICRONIZED 200 MG PO CAPS: 200.0000 mg | ORAL_CAPSULE | Freq: Every day | ORAL | Status: DC

## 2012-12-24 NOTE — Progress Notes (Signed)
[name] 01-04-1961 [id number]   History:    52 y.o.  for annual gyn exam with no complaints today. Patient was started on hormone replacement therapy in 2013 consisting of Elestrin application to one arm daily with the addition of Prometrium 200 mg for 10 days of the month. The patient had an endometrial biopsy in May of this year for any regular bleeding and biopsy was benign. Patient has had no further bleeding. She no longer has vasomotor symptoms and has been doing well. She does have history of osteopenia. Her last bone density study January 2014. She has not had a colonoscopy yet. Patient with no prior history of abnormal Pap smear. Her husband has had a vasectomy. Her mammogram was normal this year although breasts were dense.  Past medical history,surgical history, family history and social history were all reviewed and documented in the EPIC chart.  Gynecologic History Patient's last menstrual period was 05/01/2010. Contraception: post menopausal status Last Pap: 2012. Results were: normal Last mammogram: 2014. Results were: normal but dense  Obstetric History OB History  Gravida Para Term Preterm AB SAB TAB Ectopic Multiple Living  4 3 3  1 1    3     # Outcome Date GA Lbr Len/2nd Weight Sex Delivery Anes PTL Lv  4 SAB           3 TRM     M SVD   Y  2 TRM     F SVD   Y  1 TRM     F SVD   Y       ROS: A ROS was performed and pertinent positives and negatives are included in the history.  GENERAL: No fevers or chills. HEENT: No change in vision, no earache, sore throat or sinus congestion. NECK: No pain or stiffness. CARDIOVASCULAR: No chest pain or pressure. No palpitations. PULMONARY: No shortness of breath, cough or wheeze. GASTROINTESTINAL: No abdominal pain, nausea, vomiting or diarrhea, melena or bright red blood per rectum. GENITOURINARY: No urinary frequency, urgency, hesitancy or dysuria. MUSCULOSKELETAL: No joint or muscle pain, no back pain, no recent trauma.  DERMATOLOGIC: No rash, no itching, no lesions. ENDOCRINE: No polyuria, polydipsia, no heat or cold intolerance. No recent change in weight. HEMATOLOGICAL: No anemia or easy bruising or bleeding. NEUROLOGIC: No headache, seizures, numbness, tingling or weakness. PSYCHIATRIC: No depression, no loss of interest in normal activity or change in sleep pattern.     Exam: chaperone present  BP 126/78  Ht 5\' 5"  (1.651 m)  Wt 166 lb 12.8 oz (75.66 kg)  BMI 27.76 kg/m2  LMP 05/01/2010  Body mass index is 27.76 kg/(m^2).  General appearance : Well developed well nourished female. No acute distress HEENT: Neck supple, trachea midline, no carotid bruits, no thyroidmegaly Lungs: Clear to auscultation, no rhonchi or wheezes, or rib retractions  Heart: Regular rate and rhythm, no murmurs or gallops Breast:Examined in sitting and supine position were symmetrical in appearance, no palpable masses or tenderness,  no skin retraction, no nipple inversion, no nipple discharge, no skin discoloration, no axillary or supraclavicular lymphadenopathy Abdomen: no palpable masses or tenderness, no rebound or guarding Extremities: no edema or skin discoloration or tenderness  Pelvic:  Bartholin, Urethra, Skene Glands: Within normal limits             Vagina: No gross lesions or discharge  Cervix: No gross lesions or discharge  Uterus  anteverted, normal size, shape and consistency, non-tender and mobile  Adnexa  Without masses or tenderness  Anus and perineum  normal   Rectovaginal  normal sphincter tone without palpated masses or tenderness             Hemoccult cards provided     Assessment/Plan:  52 y.o. female for annual exam who's flu vaccine is up-to-date. She will return back next week in the fasting state to get her lab work which consists of the following: Comprehensive metabolic panel, vitamin D, CBC, fasting lipid profile, TSH, and urinalysis. Patient was reminded to do her monthly breast exam. We  discussed importance of calcium and vitamin D and regular exercise for osteoporosis prevention. Pap smear was not done today in accordance to the new guidelines.  New CDC guidelines is recommending patients be tested once in her lifetime for hepatitis C antibody who were born between [birth year] through 1964. This was discussed with the patient today and has agreed to be tested today.  Note: This dictation was prepared with  Dragon/digital dictation along withSmart phrase technology. Any transcriptional errors that result from this process are unintentional.   Ok Edwards MD, 9:48 AM 12/24/2012

## 2012-12-24 NOTE — Patient Instructions (Signed)

## 2012-12-28 ENCOUNTER — Other Ambulatory Visit: Payer: 59

## 2012-12-28 DIAGNOSIS — Z01419 Encounter for gynecological examination (general) (routine) without abnormal findings: Secondary | ICD-10-CM

## 2012-12-28 DIAGNOSIS — Z1159 Encounter for screening for other viral diseases: Secondary | ICD-10-CM

## 2012-12-28 DIAGNOSIS — M8580 Other specified disorders of bone density and structure, unspecified site: Secondary | ICD-10-CM

## 2012-12-28 LAB — HEPATITIS C ANTIBODY: HCV Ab: NEGATIVE

## 2012-12-28 LAB — CBC WITH DIFFERENTIAL/PLATELET
Basophils Absolute: 0 10*3/uL (ref 0.0–0.1)
HCT: 37.4 % (ref 36.0–46.0)
Hemoglobin: 13 g/dL (ref 12.0–15.0)
Lymphocytes Relative: 44 % (ref 12–46)
MCHC: 34.8 g/dL (ref 30.0–36.0)
Monocytes Absolute: 0.3 10*3/uL (ref 0.1–1.0)
Monocytes Relative: 7 % (ref 3–12)
Neutro Abs: 1.7 10*3/uL (ref 1.7–7.7)
Neutrophils Relative %: 47 % (ref 43–77)
RDW: 13.7 % (ref 11.5–15.5)
WBC: 3.6 10*3/uL — ABNORMAL LOW (ref 4.0–10.5)

## 2012-12-28 LAB — URINALYSIS W MICROSCOPIC + REFLEX CULTURE
Bacteria, UA: NONE SEEN
Casts: NONE SEEN
Hgb urine dipstick: NEGATIVE
Leukocytes, UA: NEGATIVE
Nitrite: NEGATIVE
Protein, ur: NEGATIVE mg/dL
Squamous Epithelial / LPF: NONE SEEN
Urobilinogen, UA: 0.2 mg/dL (ref 0.0–1.0)

## 2012-12-28 LAB — COMPREHENSIVE METABOLIC PANEL
ALT: 10 U/L (ref 0–35)
AST: 17 U/L (ref 0–37)
Albumin: 4.1 g/dL (ref 3.5–5.2)
BUN: 14 mg/dL (ref 6–23)
CO2: 26 mEq/L (ref 19–32)
Calcium: 9.5 mg/dL (ref 8.4–10.5)
Chloride: 106 mEq/L (ref 96–112)
Potassium: 4.3 mEq/L (ref 3.5–5.3)
Sodium: 139 mEq/L (ref 135–145)
Total Protein: 6.4 g/dL (ref 6.0–8.3)

## 2012-12-28 LAB — LIPID PANEL
Cholesterol: 185 mg/dL (ref 0–200)
HDL: 60 mg/dL (ref >39)
LDL Cholesterol: 105 mg/dL — ABNORMAL HIGH (ref 0–99)

## 2012-12-28 LAB — TSH: TSH: 0.825 u[IU]/mL (ref 0.350–4.500)

## 2012-12-29 LAB — VITAMIN D 25 HYDROXY (VIT D DEFICIENCY, FRACTURES): Vit D, 25-Hydroxy: 46 ng/mL (ref 30–89)

## 2013-01-27 ENCOUNTER — Other Ambulatory Visit: Payer: Self-pay | Admitting: Gynecology

## 2013-05-03 ENCOUNTER — Encounter: Payer: Self-pay | Admitting: Internal Medicine

## 2013-06-09 ENCOUNTER — Ambulatory Visit (AMBULATORY_SURGERY_CENTER): Payer: Self-pay | Admitting: *Deleted

## 2013-06-09 VITALS — Ht 65.0 in | Wt 168.0 lb

## 2013-06-09 DIAGNOSIS — Z1211 Encounter for screening for malignant neoplasm of colon: Secondary | ICD-10-CM

## 2013-06-09 MED ORDER — MOVIPREP 100 G PO SOLR: ORAL | Status: DC

## 2013-06-09 NOTE — Progress Notes (Signed)
Patient denies any allergies to eggs or soy. Patient's HR drops with anesthesia. Patient denies any oxygen use at home and does not take any diet/weight loss medications. EMMI education assisgned to patient on colonoscopy, this was explained and instructions given to patient.

## 2013-06-23 ENCOUNTER — Ambulatory Visit (AMBULATORY_SURGERY_CENTER): Payer: BC Managed Care – PPO | Admitting: Internal Medicine

## 2013-06-23 ENCOUNTER — Encounter: Payer: Self-pay | Admitting: Internal Medicine

## 2013-06-23 VITALS — BP 96/58 | HR 59 | Temp 98.6°F | Resp 21 | Ht 65.0 in | Wt 168.0 lb

## 2013-06-23 DIAGNOSIS — Z1211 Encounter for screening for malignant neoplasm of colon: Secondary | ICD-10-CM

## 2013-06-23 MED ORDER — SODIUM CHLORIDE 0.9 % IV SOLN: 500.0000 mL | INTRAVENOUS | Status: DC

## 2013-06-23 NOTE — Progress Notes (Signed)
Pt states her normal bp runs low, admitting bp was 100/64-, recovery bp was not unusual for pt, per pt-adm

## 2013-06-23 NOTE — Op Note (Signed)
[facility] [address] [place], [address]   COLONOSCOPY PROCEDURE REPORT  PATIENT: [name], [name]  MR#: [id number] BIRTHDATE: 12-31-1960 , 52  yrs. old GENDER: Female ENDOSCOPIST: Beverley Fiedler, MD REFERRED KT:GYBW Lily Peer, M.D. PROCEDURE DATE:  06/23/2013 PROCEDURE:   Colonoscopy, screening First Screening Colonoscopy - Avg.  risk and is 50 yrs.  old or older Yes.  Prior Negative Screening - Now for repeat screening. N/A  History of Adenoma - Now for follow-up colonoscopy & has been > or = to 3 yrs.  N/A  Polyps Removed Today? No.  Recommend repeat exam, <10 yrs? No. ASA CLASS:   Class II INDICATIONS:average risk screening and first colonoscopy. MEDICATIONS: MAC sedation, administered by CRNA and Propofol (Diprivan) 240 mg IV  DESCRIPTION OF PROCEDURE:   After the risks benefits and alternatives of the procedure were thoroughly explained, informed consent was obtained.  A digital rectal exam revealed no rectal mass.   The LB [id number]  endoscope was introduced through the anus and advanced to the cecum, which was identified by both the appendix and ileocecal valve. No adverse events experienced. The quality of the prep was good, using MoviPrep  The instrument was then slowly withdrawn as the colon was fully examined.   COLON FINDINGS: Mild diverticulosis was noted in the sigmoid colon. The colon was otherwise normal.  There was no inflammation, polyps or cancers seen.  Retroflexed views revealed no abnormalities. The time to cecum=6 minutes 27 seconds.  Withdrawal time=7 minutes 32 seconds.  The scope was withdrawn and the procedure completed. COMPLICATIONS: There were no complications.  ENDOSCOPIC IMPRESSION: 1.   Mild diverticulosis was noted in the sigmoid colon 2.   The colon was otherwise normal  RECOMMENDATIONS: 1.  High fiber diet 2.  You should continue to follow colorectal cancer screening guidelines for "routine risk" patients  with a repeat colonoscopy in 10 years.  There is no need for FOBT (stool) testing for at least 5 years.   eSigned:  Beverley Fiedler, MD 06/23/2013 9:25 AM   cc: The Patient and Reynaldo Minium, MD

## 2013-06-23 NOTE — Addendum Note (Signed)
Addended by: Maple Hudson on: 06/23/2013 04:06 PM   Modules accepted: Level of Service

## 2013-06-23 NOTE — Progress Notes (Signed)
A/ox3, pleased with MAC, report to RN 

## 2013-06-23 NOTE — Patient Instructions (Signed)

## 2013-06-24 ENCOUNTER — Telehealth: Payer: Self-pay

## 2013-06-24 NOTE — Telephone Encounter (Signed)
Called # [phone] left a message on the pt's answering machine to call back if any questions or concerns. Maw

## 2013-07-16 ENCOUNTER — Other Ambulatory Visit: Payer: Self-pay | Admitting: Gynecology

## 2013-11-05 ENCOUNTER — Other Ambulatory Visit: Payer: Self-pay

## 2013-11-10 ENCOUNTER — Other Ambulatory Visit: Payer: Self-pay

## 2013-11-10 DIAGNOSIS — Z1231 Encounter for screening mammogram for malignant neoplasm of breast: Secondary | ICD-10-CM

## 2013-11-22 ENCOUNTER — Encounter: Payer: Self-pay | Admitting: Internal Medicine

## 2013-11-29 ENCOUNTER — Ambulatory Visit
Admission: RE | Admit: 2013-11-29 | Discharge: 2013-11-29 | Disposition: A | Discharge status: Home / Self Care | Payer: BC Managed Care – PPO | Source: Ambulatory Visit

## 2013-11-29 DIAGNOSIS — Z1231 Encounter for screening mammogram for malignant neoplasm of breast: Secondary | ICD-10-CM

## 2013-12-27 ENCOUNTER — Encounter: Payer: Self-pay | Admitting: Gynecology

## 2013-12-27 ENCOUNTER — Other Ambulatory Visit (HOSPITAL_COMMUNITY)
Admission: RE | Admit: 2013-12-27 | Discharge: 2013-12-27 | Disposition: A | Discharge status: Home / Self Care | Payer: BC Managed Care – PPO | Source: Ambulatory Visit | Attending: Gynecology | Admitting: Gynecology

## 2013-12-27 ENCOUNTER — Ambulatory Visit (INDEPENDENT_AMBULATORY_CARE_PROVIDER_SITE_OTHER): Payer: BC Managed Care – PPO | Admitting: Gynecology

## 2013-12-27 VITALS — BP 126/82 | Ht 65.0 in | Wt 175.0 lb

## 2013-12-27 DIAGNOSIS — Z79890 Hormone replacement therapy (postmenopausal): Secondary | ICD-10-CM

## 2013-12-27 DIAGNOSIS — Z01419 Encounter for gynecological examination (general) (routine) without abnormal findings: Secondary | ICD-10-CM | POA: Diagnosis present

## 2013-12-27 DIAGNOSIS — Z1151 Encounter for screening for human papillomavirus (HPV): Secondary | ICD-10-CM | POA: Insufficient documentation

## 2013-12-27 DIAGNOSIS — M8580 Other specified disorders of bone density and structure, unspecified site: Secondary | ICD-10-CM

## 2013-12-27 LAB — CHOLESTEROL, TOTAL: CHOLESTEROL: 182 mg/dL (ref 0–200)

## 2013-12-27 LAB — COMPREHENSIVE METABOLIC PANEL
ALT: 10 U/L (ref 0–35)
AST: 18 U/L (ref 0–37)
Albumin: 4.2 g/dL (ref 3.5–5.2)
Alkaline Phosphatase: 60 U/L (ref 39–117)
BUN: 13 mg/dL (ref 6–23)
CO2: 27 meq/L (ref 19–32)
CREATININE: 0.84 mg/dL (ref 0.50–1.10)
Calcium: 9.5 mg/dL (ref 8.4–10.5)
Chloride: 103 mEq/L (ref 96–112)
GLUCOSE: 88 mg/dL (ref 70–99)
Potassium: 4.3 mEq/L (ref 3.5–5.3)
Sodium: 136 mEq/L (ref 135–145)
Total Bilirubin: 0.8 mg/dL (ref 0.2–1.2)
Total Protein: 6.5 g/dL (ref 6.0–8.3)

## 2013-12-27 LAB — CBC WITH DIFFERENTIAL/PLATELET
Basophils Absolute: 0 10*3/uL (ref 0.0–0.1)
Basophils Relative: 0 % (ref 0–1)
Eosinophils Absolute: 0 10*3/uL (ref 0.0–0.7)
Eosinophils Relative: 0 % (ref 0–5)
HEMATOCRIT: 39.9 % (ref 36.0–46.0)
Hemoglobin: 13.4 g/dL (ref 12.0–15.0)
Lymphocytes Relative: 37 % (ref 12–46)
Lymphs Abs: 1.9 10*3/uL (ref 0.7–4.0)
MCH: 32.5 pg (ref 26.0–34.0)
MCHC: 33.6 g/dL (ref 30.0–36.0)
MCV: 96.8 fL (ref 78.0–100.0)
MONOS PCT: 9 % (ref 3–12)
MPV: 9.6 fL (ref 9.4–12.4)
Monocytes Absolute: 0.5 10*3/uL (ref 0.1–1.0)
Neutro Abs: 2.8 10*3/uL (ref 1.7–7.7)
Neutrophils Relative %: 54 % (ref 43–77)
Platelets: 246 10*3/uL (ref 150–400)
RBC: 4.12 MIL/uL (ref 3.87–5.11)
RDW: 13.7 % (ref 11.5–15.5)
WBC: 5.2 10*3/uL (ref 4.0–10.5)

## 2013-12-27 NOTE — Patient Instructions (Signed)
Shingles Vaccine What You Need to Know WHAT IS SHINGLES?  Shingles is a painful skin rash, often with blisters. It is also called Herpes Zoster or just Zoster.  A shingles rash usually appears on one side of the face or body and lasts from 2 to 4 weeks. Its main symptom is pain, which can be quite severe. Other symptoms of shingles can include fever, headache, chills, and upset stomach. Very rarely, a shingles infection can lead to pneumonia, hearing problems, blindness, brain inflammation (encephalitis), or death.  For about 1 person in 5, severe pain can continue even after the rash clears up. This is called post-herpetic neuralgia.  Shingles is caused by the Varicella Zoster virus. This is the same virus that causes chickenpox. Only someone who has had a case of chickenpox or rarely, has gotten chickenpox vaccine, can get shingles. The virus stays in your body. It can reappear many years later to cause a case of shingles.  You cannot catch shingles from another person with shingles. However, a person who has never had chickenpox (or chickenpox vaccine) could get chickenpox from someone with shingles. This is not very common.  Shingles is far more common in people 50 and older than in younger people. It is also more common in people whose immune systems are weakened because of a disease such as cancer or drugs such as steroids or chemotherapy.  At least 1 million people get shingles per year in the [place]. SHINGLES VACCINE  A vaccine for shingles was licensed in 2006. In clinical trials, the vaccine reduced the risk of shingles by 50%. It can also reduce the pain in people who still get shingles after being vaccinated.  A single dose of shingles vaccine is recommended for adults 60 years of age and older. SOME PEOPLE SHOULD NOT GET SHINGLES VACCINE OR SHOULD WAIT A person should not get shingles vaccine if he or she:  Has ever had a life-threatening allergic reaction to gelatin, the  antibiotic neomycin, or any other component of shingles vaccine. Tell your caregiver if you have any severe allergies.  Has a weakened immune system because of current:  AIDS or another disease that affects the immune system.  Treatment with drugs that affect the immune system, such as prolonged use of high-dose steroids.  Cancer treatment, such as radiation or chemotherapy.  Cancer affecting the bone marrow or lymphatic system, such as leukemia or lymphoma.  Is pregnant, or might be pregnant. Women should not become pregnant until at least 4 weeks after getting shingles vaccine. Someone with a minor illness, such as a cold, may be vaccinated. Anyone with a moderate or severe acute illness should usually wait until he or she recovers before getting the vaccine. This includes anyone with a temperature of 101.3 F (38 C) or higher. WHAT ARE THE RISKS FROM SHINGLES VACCINE?  A vaccine, like any medicine, could possibly cause serious problems, such as severe allergic reactions. However, the risk of a vaccine causing serious harm, or death, is extremely small.  No serious problems have been identified with shingles vaccine. Mild Problems  Redness, soreness, swelling, or itching at the site of the injection (about 1 person in 3).  Headache (about 1 person in 70). Like all vaccines, shingles vaccine is being closely monitored for unusual or severe problems. WHAT IF THERE IS A MODERATE OR SEVERE REACTION? What should I look for? Any unusual condition, such as a severe allergic reaction or a high fever. If a severe allergic reaction   occurred, it would be within a few minutes to an hour after the shot. Signs of a serious allergic reaction can include difficulty breathing, weakness, hoarseness or wheezing, a fast heartbeat, hives, dizziness, paleness, or swelling of the throat. What should I do?  Call your caregiver, or get the person to a caregiver right away.  Tell the caregiver what  happened, the date and time it happened, and when the vaccination was given.  Ask the caregiver to report the reaction by filing a Vaccine Adverse Event Reporting System (VAERS) form. Or, you can file this report through the VAERS web site at www.vaers.hhs.gov or by calling 1-800-822-7967. VAERS does not provide medical advice. HOW CAN I LEARN MORE?  Ask your caregiver. He or she can give you the vaccine package insert or suggest other sources of information.  Contact the Centers for Disease Control and Prevention (CDC):  Call 1-800-232-4636 (1-800-CDC-INFO).  Visit the CDC website at www.cdc.gov/vaccines CDC Shingles Vaccine VIS (10/27/07) Document Released: 11/04/2005 Document Revised: 04/01/2011 Document Reviewed: 04/29/2012 ExitCare Patient Information 2015 [employer]. This information is not intended to replace advice given to you by your health care provider. Make sure you discuss any questions you have with your health care provider.  

## 2013-12-27 NOTE — Progress Notes (Signed)
Eliezer BottomLisa Dredge November 03, 1960 [id number]   History:    53 y.o.  for annual gyn exam with no complaints today.Patient was started on hormone replacement therapy in 2013 consisting of Elestrin application to one arm daily with the addition of Prometrium 200 mg for 10 days of the month. Patient reports no vaginal bleeding.She no longer has vasomotor symptoms and has been doing well. She does have history of osteopenia. Her last bone density study January 2014. Patient had a colonoscopy this year and was reported to be normal. Patient with no prior history of abnormal Pap smears. Her husband has had a vasectomy. Patient has had her flu vaccine.  Last bone density study January 2014 lowest T score -1.6 at the [facility]  Past medical history,surgical history, family history and social history were all reviewed and documented in the EPIC chart.  Gynecologic History Patient's last menstrual period was 05/01/2010. Contraception: vasectomy Last Pap 2012. Results were: normal Last mammogram: 2015. Results were: Patient has dense breasts and had a normal three-dimensional mammogram  Obstetric History OB History  Gravida Para Term Preterm AB SAB TAB Ectopic Multiple Living  4 3 3  1 1    3     # Outcome Date GA Lbr Len/2nd Weight Sex Delivery Anes PTL Lv  4 SAB           3 Term     M Vag-Spont   Y  2 Term     F Vag-Spont   Y  1 Term     F Vag-Spont   Y       ROS: A ROS was performed and pertinent positives and negatives are included in the history.  GENERAL: No fevers or chills. HEENT: No change in vision, no earache, sore throat or sinus congestion. NECK: No pain or stiffness. CARDIOVASCULAR: No chest pain or pressure. No palpitations. PULMONARY: No shortness of breath, cough or wheeze. GASTROINTESTINAL: No abdominal pain, nausea, vomiting or diarrhea, melena or bright red blood per rectum. GENITOURINARY: No urinary frequency, urgency, hesitancy or dysuria. MUSCULOSKELETAL: No joint or muscle pain,  no back pain, no recent trauma. DERMATOLOGIC: No rash, no itching, no lesions. ENDOCRINE: No polyuria, polydipsia, no heat or cold intolerance. No recent change in weight. HEMATOLOGICAL: No anemia or easy bruising or bleeding. NEUROLOGIC: No headache, seizures, numbness, tingling or weakness. PSYCHIATRIC: No depression, no loss of interest in normal activity or change in sleep pattern.     Exam: chaperone present  BP 126/82 mmHg  Ht 5\' 5"  (1.651 m)  Wt 175 lb (79.379 kg)  BMI 29.12 kg/m2  LMP 05/01/2010  Body mass index is 29.12 kg/(m^2).  General appearance : Well developed well nourished female. No acute distress HEENT: Neck supple, trachea midline, no carotid bruits, no thyroidmegaly Lungs: Clear to auscultation, no rhonchi or wheezes, or rib retractions  Heart: Regular rate and rhythm, no murmurs or gallops Breast:Examined in sitting and supine position were symmetrical in appearance, no palpable masses or tenderness,  no skin retraction, no nipple inversion, no nipple discharge, no skin discoloration, no axillary or supraclavicular lymphadenopathy Abdomen: no palpable masses or tenderness, no rebound or guarding Extremities: no edema or skin discoloration or tenderness  Pelvic:  Bartholin, Urethra, Skene Glands: Within normal limits             Vagina: No gross lesions or discharge  Cervix: No gross lesions or discharge  Uterus  anteverted, normal size, shape and consistency, non-tender and mobile  Adnexa  Without masses  or tenderness  Anus and perineum  normal   Rectovaginal  normal sphincter tone without palpated masses or tenderness             Hemoccult not indicated     Assessment/Plan:  53 y.o. female for annual exam who is doing well on her hormone replacement therapy. Patient reports no vaginal bleeding. Patient will return in January for her bone density study. She will need her Tdap vaccine. We discussed importance of calcium vitamin D and regular exercise for  osteoporosis prevention. We discussed importance of monthly breast exam. The following labs were ordered: Screening cholesterol, CBC, TSH, conference metabolic panel, urinalysis and vitamin D level. Patient's last bone density study in 2014 demonstrated she had osteopenia at the [facility]. Pap smear was done today.   Ok EdwardsFERNANDEZ,Jaxxon Naeem H MD, 11:49 AM 12/27/2013

## 2013-12-28 LAB — URINALYSIS W MICROSCOPIC + REFLEX CULTURE
Bacteria, UA: NONE SEEN
Bilirubin Urine: NEGATIVE
CRYSTALS: NONE SEEN
Casts: NONE SEEN
Glucose, UA: NEGATIVE mg/dL
Hgb urine dipstick: NEGATIVE
Ketones, ur: NEGATIVE mg/dL
NITRITE: NEGATIVE
Protein, ur: NEGATIVE mg/dL
SPECIFIC GRAVITY, URINE: 1.017 (ref 1.005–1.030)
Urobilinogen, UA: 0.2 mg/dL (ref 0.0–1.0)
pH: 5 (ref 5.0–8.0)

## 2013-12-28 LAB — TSH: TSH: 0.928 u[IU]/mL (ref 0.350–4.500)

## 2013-12-28 LAB — CYTOLOGY - PAP

## 2013-12-28 LAB — VITAMIN D 25 HYDROXY (VIT D DEFICIENCY, FRACTURES): VIT D 25 HYDROXY: 30 ng/mL (ref 30–100)

## 2013-12-29 LAB — URINE CULTURE
Colony Count: NO GROWTH
ORGANISM ID, BACTERIA: NO GROWTH

## 2014-02-14 ENCOUNTER — Other Ambulatory Visit: Payer: Self-pay | Admitting: Gynecology

## 2014-02-14 ENCOUNTER — Ambulatory Visit (INDEPENDENT_AMBULATORY_CARE_PROVIDER_SITE_OTHER): Payer: BLUE CROSS/BLUE SHIELD

## 2014-02-14 DIAGNOSIS — Z79890 Hormone replacement therapy (postmenopausal): Secondary | ICD-10-CM

## 2014-02-14 DIAGNOSIS — M8580 Other specified disorders of bone density and structure, unspecified site: Secondary | ICD-10-CM

## 2014-02-14 DIAGNOSIS — Z13820 Encounter for screening for osteoporosis: Secondary | ICD-10-CM

## 2014-06-09 ENCOUNTER — Other Ambulatory Visit: Payer: Self-pay

## 2014-06-09 MED ORDER — PROGESTERONE MICRONIZED 200 MG PO CAPS: 200.0000 mg | ORAL_CAPSULE | Freq: Every day | ORAL | Status: DC

## 2014-09-08 ENCOUNTER — Other Ambulatory Visit: Payer: Self-pay | Admitting: Gynecology

## 2014-11-21 ENCOUNTER — Other Ambulatory Visit: Payer: Self-pay

## 2014-11-21 DIAGNOSIS — Z1231 Encounter for screening mammogram for malignant neoplasm of breast: Secondary | ICD-10-CM

## 2014-12-20 ENCOUNTER — Other Ambulatory Visit: Payer: Self-pay | Admitting: Gynecology

## 2014-12-23 ENCOUNTER — Ambulatory Visit
Admission: RE | Admit: 2014-12-23 | Discharge: 2014-12-23 | Disposition: A | Discharge status: Home / Self Care | Payer: BLUE CROSS/BLUE SHIELD | Source: Ambulatory Visit

## 2014-12-23 DIAGNOSIS — Z1231 Encounter for screening mammogram for malignant neoplasm of breast: Secondary | ICD-10-CM

## 2015-01-05 ENCOUNTER — Ambulatory Visit (INDEPENDENT_AMBULATORY_CARE_PROVIDER_SITE_OTHER): Payer: BLUE CROSS/BLUE SHIELD | Admitting: Gynecology

## 2015-01-05 ENCOUNTER — Encounter: Payer: Self-pay | Admitting: Gynecology

## 2015-01-05 VITALS — BP 118/76 | Ht 65.0 in | Wt 180.4 lb

## 2015-01-05 DIAGNOSIS — M8580 Other specified disorders of bone density and structure, unspecified site: Secondary | ICD-10-CM

## 2015-01-05 DIAGNOSIS — Z79890 Hormone replacement therapy (postmenopausal): Secondary | ICD-10-CM

## 2015-01-05 DIAGNOSIS — Z01419 Encounter for gynecological examination (general) (routine) without abnormal findings: Secondary | ICD-10-CM

## 2015-01-05 LAB — CBC WITH DIFFERENTIAL/PLATELET
Basophils Absolute: 0.1 10*3/uL (ref 0.0–0.1)
Basophils Relative: 1 % (ref 0–1)
EOS PCT: 1 % (ref 0–5)
Eosinophils Absolute: 0.1 10*3/uL (ref 0.0–0.7)
HEMATOCRIT: 39.9 % (ref 36.0–46.0)
Hemoglobin: 13.4 g/dL (ref 12.0–15.0)
LYMPHS ABS: 2.2 10*3/uL (ref 0.7–4.0)
LYMPHS PCT: 43 % (ref 12–46)
MCH: 32.1 pg (ref 26.0–34.0)
MCHC: 33.6 g/dL (ref 30.0–36.0)
MCV: 95.5 fL (ref 78.0–100.0)
MONO ABS: 0.4 10*3/uL (ref 0.1–1.0)
MONOS PCT: 8 % (ref 3–12)
MPV: 9.4 fL (ref 8.6–12.4)
Neutro Abs: 2.4 10*3/uL (ref 1.7–7.7)
Neutrophils Relative %: 47 % (ref 43–77)
Platelets: 240 10*3/uL (ref 150–400)
RBC: 4.18 MIL/uL (ref 3.87–5.11)
RDW: 13.7 % (ref 11.5–15.5)
WBC: 5 10*3/uL (ref 4.0–10.5)

## 2015-01-05 LAB — COMPREHENSIVE METABOLIC PANEL
ALBUMIN: 4.2 g/dL (ref 3.6–5.1)
ALK PHOS: 61 U/L (ref 33–130)
ALT: 13 U/L (ref 6–29)
AST: 20 U/L (ref 10–35)
BUN: 19 mg/dL (ref 7–25)
CALCIUM: 8.9 mg/dL (ref 8.6–10.4)
CO2: 23 mmol/L (ref 20–31)
Chloride: 103 mmol/L (ref 98–110)
Creat: 0.77 mg/dL (ref 0.50–1.05)
GLUCOSE: 80 mg/dL (ref 65–99)
POTASSIUM: 4.2 mmol/L (ref 3.5–5.3)
Sodium: 138 mmol/L (ref 135–146)
Total Bilirubin: 0.8 mg/dL (ref 0.2–1.2)
Total Protein: 6.6 g/dL (ref 6.1–8.1)

## 2015-01-05 LAB — LIPID PANEL
CHOL/HDL RATIO: 3 ratio (ref <=5.0)
Cholesterol: 183 mg/dL (ref 125–200)
HDL: 61 mg/dL (ref >=46)
LDL Cholesterol: 105 mg/dL (ref <130)
TRIGLYCERIDES: 87 mg/dL (ref <150)
VLDL: 17 mg/dL (ref <30)

## 2015-01-05 LAB — TSH: TSH: 0.961 u[IU]/mL (ref 0.350–4.500)

## 2015-01-05 MED ORDER — ESTRADIOL 0.52 MG/0.87 GM (0.06%) TD GEL: TRANSDERMAL | Status: DC

## 2015-01-05 MED ORDER — PROGESTERONE MICRONIZED 200 MG PO CAPS: 200.0000 mg | ORAL_CAPSULE | Freq: Every day | ORAL | Status: DC

## 2015-01-05 NOTE — Progress Notes (Signed)
[name] November 19, 1960 [id number]   History:    54 y.o.  for annual gyn exam with no complaints today. She states that she received her flu and shingles back seen in October..Patient was started on hormone replacement therapy in 2013 consisting of Elestrin application to one arm daily with the addition of Prometrium 200 mg for 10 days of the month. Patient reports no vaginal bleeding.She no longer has vasomotor symptoms and has been doing well. She does have history of osteopenia. Her last bone density study was in 2015 which demonstrated her lowest T score was at the [facility] with a value of -1.4. There was statistically significant improvement of her [facility] with compare with 2014 all of the regions of interest with no significant change.  Past medical history,surgical history, family history and social history were all reviewed and documented in the EPIC chart.  Gynecologic History Patient's last menstrual period was 05/01/2010. Contraception: vasectomy Last Pap: 2015. Results were: normal Last mammogram: 2016. Results were: Normal three-dimensional secondary to dense breasts  Obstetric History OB History  Gravida Para Term Preterm AB SAB TAB Ectopic Multiple Living  # Outcome Date GA Lbr Len/2nd Weight Sex Delivery Anes PTL Lv  4 SAB           3 Term     M Vag-Spont   Y  2 Term     F Vag-Spont   Y  1 Term     F Vag-Spont   Y       ROS: A ROS was performed and pertinent positives and negatives are included in the history.  GENERAL: No fevers or chills. HEENT: No change in vision, no earache, sore throat or sinus congestion. NECK: No pain or stiffness. CARDIOVASCULAR: No chest pain or pressure. No palpitations. PULMONARY: No shortness of breath, cough or wheeze. GASTROINTESTINAL: No abdominal pain, nausea, vomiting or diarrhea, melena or bright red blood per rectum. GENITOURINARY: No urinary frequency, urgency, hesitancy or dysuria. MUSCULOSKELETAL: No joint  or muscle pain, no back pain, no recent trauma. DERMATOLOGIC: No rash, no itching, no lesions. ENDOCRINE: No polyuria, polydipsia, no heat or cold intolerance. No recent change in weight. HEMATOLOGICAL: No anemia or easy bruising or bleeding. NEUROLOGIC: No headache, seizures, numbness, tingling or weakness. PSYCHIATRIC: No depression, no loss of interest in normal activity or change in sleep pattern.     Exam: chaperone present  BP 118/76 mmHg  Ht  (1.651 m)  Wt 180 lb 6.4 oz (81.829 kg)  BMI 30.02 kg/m2  LMP 05/01/2010  Body mass index is 30.02 kg/(m^2).  General appearance : Well developed well nourished female. No acute distress HEENT: Eyes: no retinal hemorrhage or exudates,  Neck supple, trachea midline, no carotid bruits, no thyroidmegaly Lungs: Clear to auscultation, no rhonchi or wheezes, or rib retractions  Heart: Regular rate and rhythm, no murmurs or gallops Breast:Examined in sitting and supine position were symmetrical in appearance, no palpable masses or tenderness,  no skin retraction, no nipple inversion, no nipple discharge, no skin discoloration, no axillary or supraclavicular lymphadenopathy Abdomen: no palpable masses or tenderness, no rebound or guarding Extremities: no edema or skin discoloration or tenderness  Pelvic:  Bartholin, Urethra, Skene Glands: Within normal limits             Vagina: No gross lesions or discharge  Cervix: No gross lesions or discharge  Uterus  anteverted, normal size, shape  and consistency, non-tender and mobile  Adnexa  Without masses or tenderness  Anus and perineum  normal   Rectovaginal  normal sphincter tone without palpated masses or tenderness             Hemoccult cards provided     Assessment/Plan:  54 y.o. female for annual exam doing well on her hormone replacement therapy prescription refill provided. Patient was provided with fecal Hemoccult cards to submit to the office for testing. We discussed importance of  calcium vitamin D and regular exercise for osteoporosis prevention. Her bone density study is due next year. Pap smear not indicated according to the guidelines. The following screening blood work was ordered today: Comprehensive metabolic panel, fasting lipid profile, TSH, CBC, and urinalysis.   Ok EdwardsFERNANDEZ,Emily Berne H MD, 9:54 AM 01/05/2015

## 2015-01-06 LAB — URINALYSIS W MICROSCOPIC + REFLEX CULTURE
BACTERIA UA: NONE SEEN [HPF]
BILIRUBIN URINE: NEGATIVE
CRYSTALS: NONE SEEN [HPF]
Casts: NONE SEEN [LPF]
Glucose, UA: NEGATIVE
Hgb urine dipstick: NEGATIVE
Ketones, ur: NEGATIVE
Leukocytes, UA: NEGATIVE
Nitrite: NEGATIVE
PROTEIN: NEGATIVE
RBC / HPF: NONE SEEN RBC/HPF (ref <=2)
Specific Gravity, Urine: 1.01 (ref 1.001–1.035)
Squamous Epithelial / LPF: NONE SEEN [HPF] (ref <=5)
WBC, UA: NONE SEEN WBC/HPF (ref <=5)
YEAST: NONE SEEN [HPF]
pH: 6 (ref 5.0–8.0)

## 2015-01-06 LAB — VITAMIN D 25 HYDROXY (VIT D DEFICIENCY, FRACTURES): Vit D, 25-Hydroxy: 38 ng/mL (ref 30–100)

## 2015-11-24 ENCOUNTER — Other Ambulatory Visit: Payer: Self-pay | Admitting: Gynecology

## 2015-11-24 DIAGNOSIS — Z1231 Encounter for screening mammogram for malignant neoplasm of breast: Secondary | ICD-10-CM

## 2015-12-28 ENCOUNTER — Ambulatory Visit
Admission: RE | Admit: 2015-12-28 | Discharge: 2015-12-28 | Disposition: A | Discharge status: Home / Self Care | Payer: BLUE CROSS/BLUE SHIELD | Source: Ambulatory Visit | Attending: Gynecology | Admitting: Gynecology

## 2015-12-28 DIAGNOSIS — Z1231 Encounter for screening mammogram for malignant neoplasm of breast: Secondary | ICD-10-CM | POA: Diagnosis not present

## 2016-01-08 ENCOUNTER — Ambulatory Visit (INDEPENDENT_AMBULATORY_CARE_PROVIDER_SITE_OTHER): Payer: BLUE CROSS/BLUE SHIELD | Admitting: Gynecology

## 2016-01-08 ENCOUNTER — Encounter: Payer: Self-pay | Admitting: Gynecology

## 2016-01-08 VITALS — BP 122/70 | Ht 65.0 in | Wt 179.0 lb

## 2016-01-08 DIAGNOSIS — M8580 Other specified disorders of bone density and structure, unspecified site: Secondary | ICD-10-CM | POA: Diagnosis not present

## 2016-01-08 DIAGNOSIS — Z01419 Encounter for gynecological examination (general) (routine) without abnormal findings: Secondary | ICD-10-CM | POA: Diagnosis not present

## 2016-01-08 DIAGNOSIS — Z780 Asymptomatic menopausal state: Secondary | ICD-10-CM

## 2016-01-08 DIAGNOSIS — Z79890 Hormone replacement therapy (postmenopausal): Secondary | ICD-10-CM

## 2016-01-08 LAB — CBC WITH DIFFERENTIAL/PLATELET
BASOS ABS: 0 {cells}/uL (ref 0–200)
Basophils Relative: 0 %
EOS PCT: 1 %
Eosinophils Absolute: 49 cells/uL (ref 15–500)
HCT: 40.6 % (ref 35.0–45.0)
Hemoglobin: 13.2 g/dL (ref 11.7–15.5)
Lymphocytes Relative: 42 %
Lymphs Abs: 2058 cells/uL (ref 850–3900)
MCH: 31.5 pg (ref 27.0–33.0)
MCHC: 32.5 g/dL (ref 32.0–36.0)
MCV: 96.9 fL (ref 80.0–100.0)
MONOS PCT: 8 %
MPV: 9.6 fL (ref 7.5–12.5)
Monocytes Absolute: 392 cells/uL (ref 200–950)
NEUTROS ABS: 2401 {cells}/uL (ref 1500–7800)
NEUTROS PCT: 49 %
PLATELETS: 237 10*3/uL (ref 140–400)
RBC: 4.19 MIL/uL (ref 3.80–5.10)
RDW: 13.7 % (ref 11.0–15.0)
WBC: 4.9 10*3/uL (ref 3.8–10.8)

## 2016-01-08 LAB — TSH: TSH: 1.01 mIU/L

## 2016-01-08 MED ORDER — ESTRADIOL 0.52 MG/0.87 GM (0.06%) TD GEL: TRANSDERMAL | 4 refills | Status: DC

## 2016-01-08 MED ORDER — PROGESTERONE MICRONIZED 200 MG PO CAPS: 200.0000 mg | ORAL_CAPSULE | Freq: Every day | ORAL | 4 refills | Status: DC

## 2016-01-08 NOTE — Progress Notes (Signed)
Emily BottomLisa Cerino 1961/01/11 [id number]   History:    55 y.o.  for annual gyn exam who is asymptomatic today. All her vaccines are up-to-date. Patient was started on hormone replacement therapy in 2013 as a result of her vasomotor symptoms and has done well with no vaginal bleeding currently on Elestrin application to one arm daily with the addition of Prometrium 200 mg for 10 days of the month.She does have history of osteopenia. Her last bone density study was in 2016 which demonstrated her lowest T score was at the [facility] with a value of -1.4. There was statistically significant improvement of her [facility] with compare with 2014 all of the regions of interest with no significant change.  Past medical history,surgical history, family history and social history were all reviewed and documented in the EPIC chart.  Gynecologic History Patient's last menstrual period was 05/01/2010. Contraception: post menopausal status Last Pap: 2015. Results were: normal Last mammogram: 2017. Results were: Three-dimensional mammogram as a result of dense breasts normal  Obstetric History OB History  Gravida Para Term Preterm AB Living  4 3 3   1 3   SAB TAB Ectopic Multiple Live Births  1       3    # Outcome Date GA Lbr Len/2nd Weight Sex Delivery Anes PTL Lv  4 SAB           3 Term     M Vag-Spont   LIV  2 Term     F Vag-Spont   LIV  1 Term     F Vag-Spont   LIV       ROS: A ROS was performed and pertinent positives and negatives are included in the history.  GENERAL: No fevers or chills. HEENT: No change in vision, no earache, sore throat or sinus congestion. NECK: No pain or stiffness. CARDIOVASCULAR: No chest pain or pressure. No palpitations. PULMONARY: No shortness of breath, cough or wheeze. GASTROINTESTINAL: No abdominal pain, nausea, vomiting or diarrhea, melena or bright red blood per rectum. GENITOURINARY: No urinary frequency, urgency, hesitancy or dysuria. MUSCULOSKELETAL: No joint or  muscle pain, no back pain, no recent trauma. DERMATOLOGIC: No rash, no itching, no lesions. ENDOCRINE: No polyuria, polydipsia, no heat or cold intolerance. No recent change in weight. HEMATOLOGICAL: No anemia or easy bruising or bleeding. NEUROLOGIC: No headache, seizures, numbness, tingling or weakness. PSYCHIATRIC: No depression, no loss of interest in normal activity or change in sleep pattern.     Exam: chaperone present  BP 122/70   Ht 5\' 5"  (1.651 m)   Wt 179 lb (81.2 kg)   LMP 05/01/2010   BMI 29.79 kg/m   Body mass index is 29.79 kg/m.  General appearance : Well developed well nourished female. No acute distress HEENT: Eyes: no retinal hemorrhage or exudates,  Neck supple, trachea midline, no carotid bruits, no thyroidmegaly Lungs: Clear to auscultation, no rhonchi or wheezes, or rib retractions  Heart: Regular rate and rhythm, no murmurs or gallops Breast:Examined in sitting and supine position were symmetrical in appearance, no palpable masses or tenderness,  no skin retraction, no nipple inversion, no nipple discharge, no skin discoloration, no axillary or supraclavicular lymphadenopathy Abdomen: no palpable masses or tenderness, no rebound or guarding Extremities: no edema or skin discoloration or tenderness  Pelvic:  Bartholin, Urethra, Skene Glands: Within normal limits             Vagina: No gross lesions or discharge  Cervix: No gross lesions or  discharge  Uterus: anteverted,, normal size, shape and consistency, non-tender and mobile  Adnexa  Without masses or tenderness  Anus and perineum  normal   Rectovaginal  normal sphincter tone without palpated masses or tenderness             Hemoccult cards provided     Assessment/Plan:  55 y.o. female for annual exam with history of osteopenia stable bone density when studies were reviewed for the past. Patient scheduled for next bone density study in 2018. Pap smear not indicated this year. Colonoscopy up-to-date. Flu  vaccine is up-to-date. The following fasting blood work will be ordered: Comprehensive metabolic panel, fasting lipid profile, TSH, CBC, and urinalysis. We discussed importance of monthly breast exam. We discussed importance of calcium vitamin D and weightbearing exercises for osteoporosis prevention.   Ok EdwardsFERNANDEZ,Indio Santilli H MD, 9:59 AM 01/08/2016

## 2016-01-09 LAB — URINALYSIS W MICROSCOPIC + REFLEX CULTURE
BACTERIA UA: NONE SEEN [HPF]
BILIRUBIN URINE: NEGATIVE
CRYSTALS: NONE SEEN [HPF]
Casts: NONE SEEN [LPF]
GLUCOSE, UA: NEGATIVE
Hgb urine dipstick: NEGATIVE
KETONES UR: NEGATIVE
Nitrite: NEGATIVE
PROTEIN: NEGATIVE
RBC / HPF: NONE SEEN RBC/HPF (ref <=2)
Specific Gravity, Urine: 1.012 (ref 1.001–1.035)
Yeast: NONE SEEN [HPF]
pH: 6 (ref 5.0–8.0)

## 2016-01-09 LAB — COMPREHENSIVE METABOLIC PANEL
ALT: 14 U/L (ref 6–29)
AST: 23 U/L (ref 10–35)
Albumin: 4.3 g/dL (ref 3.6–5.1)
Alkaline Phosphatase: 56 U/L (ref 33–130)
BUN: 15 mg/dL (ref 7–25)
CHLORIDE: 104 mmol/L (ref 98–110)
CO2: 24 mmol/L (ref 20–31)
CREATININE: 0.79 mg/dL (ref 0.50–1.05)
Calcium: 9 mg/dL (ref 8.6–10.4)
Glucose, Bld: 88 mg/dL (ref 65–99)
Potassium: 4.1 mmol/L (ref 3.5–5.3)
SODIUM: 139 mmol/L (ref 135–146)
Total Bilirubin: 1 mg/dL (ref 0.2–1.2)
Total Protein: 6.8 g/dL (ref 6.1–8.1)

## 2016-01-09 LAB — LIPID PANEL
Cholesterol: 201 mg/dL — ABNORMAL HIGH (ref <200)
HDL: 61 mg/dL (ref >50)
LDL CALC: 112 mg/dL — AB (ref <100)
Total CHOL/HDL Ratio: 3.3 Ratio (ref <5.0)
Triglycerides: 142 mg/dL (ref <150)
VLDL: 28 mg/dL (ref <30)

## 2016-01-09 LAB — VITAMIN D 25 HYDROXY (VIT D DEFICIENCY, FRACTURES): VIT D 25 HYDROXY: 32 ng/mL (ref 30–100)

## 2016-01-10 LAB — URINE CULTURE

## 2016-06-05 ENCOUNTER — Encounter: Payer: Self-pay | Admitting: Gynecology

## 2016-07-03 ENCOUNTER — Telehealth: Payer: Self-pay | Admitting: *Deleted

## 2016-07-03 MED ORDER — ESTRADIOL 0.52 MG/0.87 GM (0.06%) TD GEL: TRANSDERMAL | 2 refills | Status: DC

## 2016-07-03 NOTE — Telephone Encounter (Signed)
Pt called requesting Elestrin gel 0.06% sent to local pharmacy [facility] said [facility] was able to transfer progesterone 200 mg to local pharmacy. Rx sent.

## 2016-10-22 DIAGNOSIS — L820 Inflamed seborrheic keratosis: Secondary | ICD-10-CM | POA: Diagnosis not present

## 2016-10-22 DIAGNOSIS — L821 Other seborrheic keratosis: Secondary | ICD-10-CM | POA: Diagnosis not present

## 2016-11-05 DIAGNOSIS — Z23 Encounter for immunization: Secondary | ICD-10-CM | POA: Diagnosis not present

## 2016-12-20 ENCOUNTER — Other Ambulatory Visit: Payer: Self-pay | Admitting: Obstetrics & Gynecology

## 2016-12-20 DIAGNOSIS — Z1231 Encounter for screening mammogram for malignant neoplasm of breast: Secondary | ICD-10-CM

## 2016-12-25 DIAGNOSIS — H04123 Dry eye syndrome of bilateral lacrimal glands: Secondary | ICD-10-CM | POA: Diagnosis not present

## 2017-01-08 ENCOUNTER — Ambulatory Visit (INDEPENDENT_AMBULATORY_CARE_PROVIDER_SITE_OTHER): Payer: BLUE CROSS/BLUE SHIELD | Admitting: Obstetrics & Gynecology

## 2017-01-08 ENCOUNTER — Encounter: Payer: Self-pay | Admitting: Obstetrics & Gynecology

## 2017-01-08 VITALS — BP 126/84 | Ht 64.75 in | Wt 184.0 lb

## 2017-01-08 DIAGNOSIS — Z1151 Encounter for screening for human papillomavirus (HPV): Secondary | ICD-10-CM | POA: Diagnosis not present

## 2017-01-08 DIAGNOSIS — Z13820 Encounter for screening for osteoporosis: Secondary | ICD-10-CM | POA: Diagnosis not present

## 2017-01-08 DIAGNOSIS — Z01419 Encounter for gynecological examination (general) (routine) without abnormal findings: Secondary | ICD-10-CM | POA: Diagnosis not present

## 2017-01-08 DIAGNOSIS — Z79890 Hormone replacement therapy (postmenopausal): Secondary | ICD-10-CM

## 2017-01-08 MED ORDER — ESTRADIOL 0.52 MG/0.87 GM (0.06%) TD GEL: TRANSDERMAL | 4 refills | Status: DC

## 2017-01-08 MED ORDER — PROGESTERONE MICRONIZED 200 MG PO CAPS: 200.0000 mg | ORAL_CAPSULE | Freq: Every day | ORAL | 4 refills | Status: DC

## 2017-01-08 NOTE — Progress Notes (Signed)
[name] 1960-04-14 [id number]   History:    56 y.o. 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  2 older daughters working in [place], [place] 55 [place]  RP:  Established patient presenting for annual gyn exam   HPI:  Menopause, well on HRT with [name] gel and [name] 200 mg at bedtime 10 days/months.  Would like to wean if Bone Density stable.  No menopausal symptoms on it.  No postmenopausal bleeding.  No pelvic pain.  Breasts normal.  Urine and bowel movements normal.  Physically active.  Past medical history,surgical history, family history and social history were all reviewed and documented in the EPIC chart.  Gynecologic History Patient's last menstrual period was 05/01/2010. Contraception: post menopausal status Last Pap: 2015. Results were: normal Last mammogram: 12/2015. Results were: Negative [facility] 2015 Bone density 2016 Osteopenia T-Score -1.4  Obstetric History OB History  Gravida Para Term Preterm AB Living  4 3 3   1 3   SAB TAB Ectopic Multiple Live Births  1       3    # Outcome Date GA Lbr Len/2nd Weight Sex Delivery Anes PTL Lv  4 SAB           3 Term     M Vag-Spont   LIV  2 Term     F Vag-Spont   LIV  1 Term     F Vag-Spont   LIV       ROS: A ROS was performed and pertinent positives and negatives are included in the history.  GENERAL: No fevers or chills. HEENT: No change in vision, no earache, sore throat or sinus congestion. NECK: No pain or stiffness. CARDIOVASCULAR: No chest pain or pressure. No palpitations. PULMONARY: No shortness of breath, cough or wheeze. GASTROINTESTINAL: No abdominal pain, nausea, vomiting or diarrhea, melena or bright red blood per rectum. GENITOURINARY: No urinary frequency, urgency, hesitancy or dysuria. MUSCULOSKELETAL: No joint or muscle pain, no back pain, no recent trauma. DERMATOLOGIC: No rash, no itching, no lesions. ENDOCRINE: No polyuria, polydipsia, no heat or cold intolerance. No recent change in  weight. HEMATOLOGICAL: No anemia or easy bruising or bleeding. NEUROLOGIC: No headache, seizures, numbness, tingling or weakness. PSYCHIATRIC: No depression, no loss of interest in normal activity or change in sleep pattern.     Exam:   BP 126/84   Ht 5' 4.75" (1.645 m)   Wt 184 lb (83.5 kg)   LMP 05/01/2010   BMI 30.86 kg/m   Body mass index is 30.86 kg/m.  General appearance : Well developed well nourished female. No acute distress HEENT: Eyes: no retinal hemorrhage or exudates,  Neck supple, trachea midline, no carotid bruits, no thyroidmegaly Lungs: Clear to auscultation, no rhonchi or wheezes, or rib retractions  Heart: Regular rate and rhythm, no murmurs or gallops Breast:Examined in sitting and supine position were symmetrical in appearance, no palpable masses or tenderness,  no skin retraction, no nipple inversion, no nipple discharge, no skin discoloration, no axillary or supraclavicular lymphadenopathy Abdomen: no palpable masses or tenderness, no rebound or guarding Extremities: no edema or skin discoloration or tenderness  Pelvic: Vulva normal  Bartholin, Urethra, Skene Glands: Within normal limits             Vagina: No gross lesions or discharge  Cervix: No gross lesions or discharge.  Pap/HR HPV done.  Uterus  AV, normal size, shape and consistency, non-tender and mobile  Adnexa  Without masses or tenderness  Anus and perineum  normal     Assessment/Plan:  56 y.o. female for annual exam   1. Encounter for routine gynecological examination with Papanicolaou smear of cervix Normal gynecologic exam.  Pap with high-risk HPV done.  Breast exam normal.  Will schedule screening mammogram.  Colonoscopy 2015.  Health labs with family physician.  2. Post-menopause on HRT (hormone replacement therapy) Patient will follow up for bone density and will wean and stop hormone replacement therapy if stable.  In the meantime [name] gel and [name] represcribed.  No  contraindication.  Usage, risks and benefits discussed.  3. Screening for osteoporosis Follow-up bone density.  Vitamin D supplements, calcium rich nutrition and regular weightbearing physical activity recommended. - DG Bone Density; Future  Other orders - Multiple Vitamin (MULTIVITAMIN) tablet; Take 1 tablet by mouth daily. - cholecalciferol (VITAMIN D) 1000 units tablet; Take 1,000 Units by mouth daily. - Estradiol ([name]) 0.52 MG/0.87 GM (0.06%) GEL; Apply to one arm only daily - progesterone ([name]) 200 MG capsule; Take 1 capsule (200 mg total) by mouth daily. Take one 10 days of the month  Emily DelMarie-Lyne Daryus Sowash MD, 9:26 AM 01/08/2017

## 2017-01-12 ENCOUNTER — Encounter: Payer: Self-pay | Admitting: Obstetrics & Gynecology

## 2017-01-12 NOTE — Patient Instructions (Signed)
1. Encounter for routine gynecological examination with Papanicolaou smear of cervix Normal gynecologic exam.  Pap with high-risk HPV done.  Breast exam normal.  Will schedule screening mammogram.  Colonoscopy 2015.  Health labs with family physician.  2. Post-menopause on HRT (hormone replacement therapy) Patient will follow up for bone density and will wean and stop hormone replacement therapy if stable.  In the meantime Elestrin gel and Prometrium represcribed.  No contraindication.  Usage, risks and benefits discussed.  3. Screening for osteoporosis Follow-up bone density.  Vitamin D supplements, calcium rich nutrition and regular weightbearing physical activity recommended. - DG Bone Density; Future  Other orders - Multiple Vitamin (MULTIVITAMIN) tablet; Take 1 tablet by mouth daily. - cholecalciferol (VITAMIN D) 1000 units tablet; Take 1,000 Units by mouth daily. - Estradiol (ELESTRIN) 0.52 MG/0.87 GM (0.06%) GEL; Apply to one arm only daily - progesterone (PROMETRIUM) 200 MG capsule; Take 1 capsule (200 mg total) by mouth daily. Take one 10 days of the month  [name], it was a pleasure meeting you today!  I will inform you of your results as soon as available.   Health Maintenance for Postmenopausal Women Menopause is a normal process in which your reproductive ability comes to an end. This process happens gradually over a span of months to years, usually between the ages of 9 and 56. Menopause is complete when you have missed 12 consecutive menstrual periods. It is important to talk with your health care provider about some of the most common conditions that affect postmenopausal women, such as heart disease, cancer, and bone loss (osteoporosis). Adopting a healthy lifestyle and getting preventive care can help to promote your health and wellness. Those actions can also lower your chances of developing some of these common conditions. What should I know about menopause? During menopause,  you may experience a number of symptoms, such as:  Moderate-to-severe hot flashes.  Night sweats.  Decrease in sex drive.  Mood swings.  Headaches.  Tiredness.  Irritability.  Memory problems.  Insomnia.  Choosing to treat or not to treat menopausal changes is an individual decision that you make with your health care provider. What should I know about hormone replacement therapy and supplements? Hormone therapy products are effective for treating symptoms that are associated with menopause, such as hot flashes and night sweats. Hormone replacement carries certain risks, especially as you become older. If you are thinking about using estrogen or estrogen with progestin treatments, discuss the benefits and risks with your health care provider. What should I know about heart disease and stroke? Heart disease, heart attack, and stroke become more likely as you age. This may be due, in part, to the hormonal changes that your body experiences during menopause. These can affect how your body processes dietary fats, triglycerides, and cholesterol. Heart attack and stroke are both medical emergencies. There are many things that you can do to help prevent heart disease and stroke:  Have your blood pressure checked at least every 1-2 years. High blood pressure causes heart disease and increases the risk of stroke.  If you are 85-79 years old, ask your health care provider if you should take aspirin to prevent a heart attack or a stroke.  Do not use any tobacco products, including cigarettes, chewing tobacco, or electronic cigarettes. If you need help quitting, ask your health care provider.  It is important to eat a healthy diet and maintain a healthy weight. ? Be sure to include plenty of vegetables, fruits, low-fat dairy products,  and lean protein. ? Avoid eating foods that are high in solid fats, added sugars, or salt (sodium).  Get regular exercise. This is one of the most important  things that you can do for your health. ? Try to exercise for at least 150 minutes each week. The type of exercise that you do should increase your heart rate and make you sweat. This is known as moderate-intensity exercise. ? Try to do strengthening exercises at least twice each week. Do these in addition to the moderate-intensity exercise.  Know your numbers.Ask your health care provider to check your cholesterol and your blood glucose. Continue to have your blood tested as directed by your health care provider.  What should I know about cancer screening? There are several types of cancer. Take the following steps to reduce your risk and to catch any cancer development as early as possible. Breast Cancer  Practice breast self-awareness. ? This means understanding how your breasts normally appear and feel. ? It also means doing regular breast self-exams. Let your health care provider know about any changes, no matter how small.  If you are 77 or older, have a clinician do a breast exam (clinical breast exam or CBE) every year. Depending on your age, family history, and medical history, it may be recommended that you also have a yearly breast X-ray (mammogram).  If you have a family history of breast cancer, talk with your health care provider about genetic screening.  If you are at high risk for breast cancer, talk with your health care provider about having an MRI and a mammogram every year.  Breast cancer (BRCA) gene test is recommended for women who have family members with BRCA-related cancers. Results of the assessment will determine the need for genetic counseling and BRCA1 and for BRCA2 testing. BRCA-related cancers include these types: ? Breast. This occurs in males or females. ? Ovarian. ? Tubal. This may also be called fallopian tube cancer. ? Cancer of the abdominal or pelvic lining (peritoneal cancer). ? Prostate. ? Pancreatic.  Cervical, Uterine, and Ovarian Cancer Your  health care provider may recommend that you be screened regularly for cancer of the pelvic organs. These include your ovaries, uterus, and vagina. This screening involves a pelvic exam, which includes checking for microscopic changes to the surface of your cervix (Pap test).  For women ages 21-65, health care providers may recommend a pelvic exam and a Pap test every three years. For women ages 49-65, they may recommend the Pap test and pelvic exam, combined with testing for human papilloma virus (HPV), every five years. Some types of HPV increase your risk of cervical cancer. Testing for HPV may also be done on women of any age who have unclear Pap test results.  Other health care providers may not recommend any screening for nonpregnant women who are considered low risk for pelvic cancer and have no symptoms. Ask your health care provider if a screening pelvic exam is right for you.  If you have had past treatment for cervical cancer or a condition that could lead to cancer, you need Pap tests and screening for cancer for at least 20 years after your treatment. If Pap tests have been discontinued for you, your risk factors (such as having a new sexual partner) need to be reassessed to determine if you should start having screenings again. Some women have medical problems that increase the chance of getting cervical cancer. In these cases, your health care provider may recommend that you  have screening and Pap tests more often.  If you have a family history of uterine cancer or ovarian cancer, talk with your health care provider about genetic screening.  If you have vaginal bleeding after reaching menopause, tell your health care provider.  There are currently no reliable tests available to screen for ovarian cancer.  Lung Cancer Lung cancer screening is recommended for adults 68-59 years old who are at high risk for lung cancer because of a history of smoking. A yearly low-dose CT scan of the lungs  is recommended if you:  Currently smoke.  Have a history of at least 30 pack-years of smoking and you currently smoke or have quit within the past 15 years. A pack-year is smoking an average of one pack of cigarettes per day for one year.  Yearly screening should:  Continue until it has been 15 years since you quit.  Stop if you develop a health problem that would prevent you from having lung cancer treatment.  Colorectal Cancer  This type of cancer can be detected and can often be prevented.  Routine colorectal cancer screening usually begins at age 16 and continues through age 85.  If you have risk factors for colon cancer, your health care provider may recommend that you be screened at an earlier age.  If you have a family history of colorectal cancer, talk with your health care provider about genetic screening.  Your health care provider may also recommend using home test kits to check for hidden blood in your stool.  A small camera at the end of a tube can be used to examine your colon directly (sigmoidoscopy or colonoscopy). This is done to check for the earliest forms of colorectal cancer.  Direct examination of the colon should be repeated every 5-10 years until age 10. However, if early forms of precancerous polyps or small growths are found or if you have a family history or genetic risk for colorectal cancer, you may need to be screened more often.  Skin Cancer  Check your skin from head to toe regularly.  Monitor any moles. Be sure to tell your health care provider: ? About any new moles or changes in moles, especially if there is a change in a mole's shape or color. ? If you have a mole that is larger than the size of a pencil eraser.  If any of your family members has a history of skin cancer, especially at a young age, talk with your health care provider about genetic screening.  Always use sunscreen. Apply sunscreen liberally and repeatedly throughout the  day.  Whenever you are outside, protect yourself by wearing long sleeves, pants, a wide-brimmed hat, and sunglasses.  What should I know about osteoporosis? Osteoporosis is a condition in which bone destruction happens more quickly than new bone creation. After menopause, you may be at an increased risk for osteoporosis. To help prevent osteoporosis or the bone fractures that can happen because of osteoporosis, the following is recommended:  If you are 17-76 years old, get at least 1,000 mg of calcium and at least 600 mg of vitamin D per day.  If you are older than age 83 but younger than age 46, get at least 1,200 mg of calcium and at least 600 mg of vitamin D per day.  If you are older than age 70, get at least 1,200 mg of calcium and at least 800 mg of vitamin D per day.  Smoking and excessive alcohol intake increase the  risk of osteoporosis. Eat foods that are rich in calcium and vitamin D, and do weight-bearing exercises several times each week as directed by your health care provider. What should I know about how menopause affects my mental health? Depression may occur at any age, but it is more common as you become older. Common symptoms of depression include:  Low or sad mood.  Changes in sleep patterns.  Changes in appetite or eating patterns.  Feeling an overall lack of motivation or enjoyment of activities that you previously enjoyed.  Frequent crying spells.  Talk with your health care provider if you think that you are experiencing depression. What should I know about immunizations? It is important that you get and maintain your immunizations. These include:  Tetanus, diphtheria, and pertussis (Tdap) booster vaccine.  Influenza every year before the flu season begins.  Pneumonia vaccine.  Shingles vaccine.  Your health care provider may also recommend other immunizations. This information is not intended to replace advice given to you by your health care provider.  Make sure you discuss any questions you have with your health care provider. Document Released: 03/01/2005 Document Revised: 07/28/2015 Document Reviewed: 10/11/2014 Elsevier Interactive Patient Education  2018 [employer].

## 2017-01-13 LAB — PAP, TP IMAGING W/ HPV RNA, RFLX HPV TYPE 16,18/45: HPV DNA HIGH RISK: NOT DETECTED

## 2017-01-21 DIAGNOSIS — M8580 Other specified disorders of bone density and structure, unspecified site: Secondary | ICD-10-CM

## 2017-01-21 HISTORY — DX: Other specified disorders of bone density and structure, unspecified site: M85.80

## 2017-01-22 ENCOUNTER — Ambulatory Visit
Admission: RE | Admit: 2017-01-22 | Discharge: 2017-01-22 | Disposition: A | Discharge status: Home / Self Care | Payer: BLUE CROSS/BLUE SHIELD | Source: Ambulatory Visit | Attending: Obstetrics & Gynecology | Admitting: Obstetrics & Gynecology

## 2017-01-22 DIAGNOSIS — Z1231 Encounter for screening mammogram for malignant neoplasm of breast: Secondary | ICD-10-CM

## 2017-01-23 ENCOUNTER — Other Ambulatory Visit: Payer: Self-pay | Admitting: Gynecology

## 2017-01-23 DIAGNOSIS — Z13820 Encounter for screening for osteoporosis: Secondary | ICD-10-CM

## 2017-02-11 ENCOUNTER — Encounter: Payer: Self-pay | Admitting: Gynecology

## 2017-02-11 ENCOUNTER — Other Ambulatory Visit: Payer: Self-pay | Admitting: Gynecology

## 2017-02-11 ENCOUNTER — Ambulatory Visit (INDEPENDENT_AMBULATORY_CARE_PROVIDER_SITE_OTHER): Payer: BLUE CROSS/BLUE SHIELD

## 2017-02-11 DIAGNOSIS — M8588 Other specified disorders of bone density and structure, other site: Secondary | ICD-10-CM | POA: Diagnosis not present

## 2017-02-11 DIAGNOSIS — Z13820 Encounter for screening for osteoporosis: Secondary | ICD-10-CM

## 2017-07-25 DIAGNOSIS — S61412A Laceration without foreign body of left hand, initial encounter: Secondary | ICD-10-CM | POA: Diagnosis not present

## 2017-08-06 DIAGNOSIS — S61412D Laceration without foreign body of left hand, subsequent encounter: Secondary | ICD-10-CM | POA: Diagnosis not present

## 2017-11-03 DIAGNOSIS — M7061 Trochanteric bursitis, right hip: Secondary | ICD-10-CM | POA: Diagnosis not present

## 2017-11-03 DIAGNOSIS — M25551 Pain in right hip: Secondary | ICD-10-CM | POA: Diagnosis not present

## 2017-11-14 DIAGNOSIS — M25551 Pain in right hip: Secondary | ICD-10-CM | POA: Diagnosis not present

## 2017-11-18 DIAGNOSIS — M25551 Pain in right hip: Secondary | ICD-10-CM | POA: Diagnosis not present

## 2017-11-21 DIAGNOSIS — M25551 Pain in right hip: Secondary | ICD-10-CM | POA: Diagnosis not present

## 2017-11-25 DIAGNOSIS — M25551 Pain in right hip: Secondary | ICD-10-CM | POA: Diagnosis not present

## 2017-11-28 DIAGNOSIS — M25551 Pain in right hip: Secondary | ICD-10-CM | POA: Diagnosis not present

## 2017-12-02 DIAGNOSIS — M25551 Pain in right hip: Secondary | ICD-10-CM | POA: Diagnosis not present

## 2017-12-09 DIAGNOSIS — M25551 Pain in right hip: Secondary | ICD-10-CM | POA: Diagnosis not present

## 2017-12-23 DIAGNOSIS — M7061 Trochanteric bursitis, right hip: Secondary | ICD-10-CM | POA: Diagnosis not present

## 2017-12-30 ENCOUNTER — Other Ambulatory Visit: Payer: Self-pay | Admitting: Obstetrics & Gynecology

## 2017-12-30 DIAGNOSIS — Z1231 Encounter for screening mammogram for malignant neoplasm of breast: Secondary | ICD-10-CM

## 2018-01-30 ENCOUNTER — Ambulatory Visit (INDEPENDENT_AMBULATORY_CARE_PROVIDER_SITE_OTHER): Payer: BLUE CROSS/BLUE SHIELD | Admitting: Obstetrics & Gynecology

## 2018-01-30 ENCOUNTER — Encounter: Payer: Self-pay | Admitting: Obstetrics & Gynecology

## 2018-01-30 VITALS — BP 120/78 | Ht 64.75 in | Wt 181.0 lb

## 2018-01-30 DIAGNOSIS — Z780 Asymptomatic menopausal state: Secondary | ICD-10-CM

## 2018-01-30 DIAGNOSIS — M8588 Other specified disorders of bone density and structure, other site: Secondary | ICD-10-CM | POA: Diagnosis not present

## 2018-01-30 DIAGNOSIS — Z01419 Encounter for gynecological examination (general) (routine) without abnormal findings: Secondary | ICD-10-CM | POA: Diagnosis not present

## 2018-01-30 DIAGNOSIS — E6609 Other obesity due to excess calories: Secondary | ICD-10-CM

## 2018-01-30 DIAGNOSIS — Z6830 Body mass index (BMI) 30.0-30.9, adult: Secondary | ICD-10-CM

## 2018-01-30 NOTE — Progress Notes (Signed)
[name] 18-Nov-1960 [id number]   History:    59 y.o. [id number] Married  RP:  Established patient presenting for annual gyn exam   HPI: Menopause, well on no HRT x 07/2017.  No PMB.  No pelvic pain.  No pain with intercourse.  Urine and bowel movements normal.  Breasts normal.  Body mass index stable at 30.35.  Exercising regularly.  Will lower the calories and carbs.  Fasting health labs here today.  Past medical history,surgical history, family history and social history were all reviewed and documented in the EPIC chart.  Gynecologic History Patient's last menstrual period was 05/01/2010. Contraception: post menopausal status Last Pap: 12/2016. Results were: Negative/HPV HR neg Last mammogram: 01/2017. Results were: Negative Bone Density: 01/2017 Osteopenia T-Score -1.2 at Spine Colonoscopy: 2015  Obstetric History OB History  Gravida Para Term Preterm AB Living  4 3 3   1 3   SAB TAB Ectopic Multiple Live Births  1       3    # Outcome Date GA Lbr Len/2nd Weight Sex Delivery Anes PTL Lv  4 SAB           3 Term     M Vag-Spont   LIV  2 Term     F Vag-Spont   LIV  1 Term     F Vag-Spont   LIV     ROS: A ROS was performed and pertinent positives and negatives are included in the history.  GENERAL: No fevers or chills. HEENT: No change in vision, no earache, sore throat or sinus congestion. NECK: No pain or stiffness. CARDIOVASCULAR: No chest pain or pressure. No palpitations. PULMONARY: No shortness of breath, cough or wheeze. GASTROINTESTINAL: No abdominal pain, nausea, vomiting or diarrhea, melena or bright red blood per rectum. GENITOURINARY: No urinary frequency, urgency, hesitancy or dysuria. MUSCULOSKELETAL: No joint or muscle pain, no back pain, no recent trauma. DERMATOLOGIC: No rash, no itching, no lesions. ENDOCRINE: No polyuria, polydipsia, no heat or cold intolerance. No recent change in weight. HEMATOLOGICAL: No anemia or easy bruising or bleeding. NEUROLOGIC: No  headache, seizures, numbness, tingling or weakness. PSYCHIATRIC: No depression, no loss of interest in normal activity or change in sleep pattern.     Exam:   BP 120/78   Ht 5' 4.75" (1.645 m)   Wt 181 lb (82.1 kg)   LMP 05/01/2010   BMI 30.35 kg/m   Body mass index is 30.35 kg/m.  General appearance : Well developed well nourished female. No acute distress HEENT: Eyes: no retinal hemorrhage or exudates,  Neck supple, trachea midline, no carotid bruits, no thyroidmegaly Lungs: Clear to auscultation, no rhonchi or wheezes, or rib retractions  Heart: Regular rate and rhythm, no murmurs or gallops Breast:Examined in sitting and supine position were symmetrical in appearance, no palpable masses or tenderness,  no skin retraction, no nipple inversion, no nipple discharge, no skin discoloration, no axillary or supraclavicular lymphadenopathy Abdomen: no palpable masses or tenderness, no rebound or guarding Extremities: no edema or skin discoloration or tenderness  Pelvic: Vulva: Normal             Vagina: No gross lesions or discharge  Cervix: No gross lesions or discharge  Uterus  AV, normal size, shape and consistency, non-tender and mobile  Adnexa  Without masses or tenderness  Anus: Normal   Assessment/Plan:  58 y.o. female for annual exam   1. Well female exam with routine gynecological exam Normal gynecologic exam.  Pap test in  December 2018 was negative with negative high-risk HPV.  No indication to repeat this year.  Breast exam normal.  Scheduled for screening mammogram on February 05, 2018.  Colonoscopy in 2015.  Fasting health labs here today.  - CBC - Comp Met (CMET) - Lipid panel - TSH - VITAMIN D 25 Hydroxy (Vit-D Deficiency, Fractures)  2. Postmenopausal Stopped hormone replacement therapy in July 2019.  Doing well on no hormone replacement therapy.  No postmenopausal bleeding.  3. Osteopenia of lumbar spine Osteopenia at the spine with a T score at -1.2 on bone  density January 2019.  Continue with vitamin D supplements, calcium intake of 1.2 to 1.5 g a day and regular weightbearing physical activities.  We will repeat a bone density in January 2021.  4. Class 1 obesity due to excess calories without serious comorbidity with body mass index (BMI) of 30.0 to 30.9 in adult Recommend lower calorie/carb diet such as [employer].  Continue with regular aerobic physical activities and physical therapy exercises.  Princess Bruins MD, 9:50 AM 01/30/2018

## 2018-01-30 NOTE — Patient Instructions (Signed)
1. Well female exam with routine gynecological exam Normal gynecologic exam.  Pap test in December 2018 was negative with negative high-risk HPV.  No indication to repeat this year.  Breast exam normal.  Scheduled for screening mammogram on February 05, 2018.  Colonoscopy in 2015.  Fasting health labs here today.  - CBC - Comp Met (CMET) - Lipid panel - TSH - VITAMIN D 25 Hydroxy (Vit-D Deficiency, Fractures)  2. Postmenopausal Stopped hormone replacement therapy in July 2019.  Doing well on no hormone replacement therapy.  No postmenopausal bleeding.  3. Osteopenia of lumbar spine Osteopenia at the spine with a T score at -1.2 on bone density January 2019.  Continue with vitamin D supplements, calcium intake of 1.2 to 1.5 g a day and regular weightbearing physical activities.  We will repeat a bone density in January 2021.  4. Class 1 obesity due to excess calories without serious comorbidity with body mass index (BMI) of 30.0 to 30.9 in adult Recommend lower calorie/carb diet such as [employer].  Continue with regular aerobic physical activities and physical therapy exercises.  [name], it was a pleasure seeing you today!  I will inform you of your results as soon as they are available.

## 2018-01-31 LAB — CBC
HCT: 39.8 % (ref 35.0–45.0)
Hemoglobin: 13.7 g/dL (ref 11.7–15.5)
MCH: 33 pg (ref 27.0–33.0)
MCHC: 34.4 g/dL (ref 32.0–36.0)
MCV: 95.9 fL (ref 80.0–100.0)
MPV: 9.8 fL (ref 7.5–12.5)
Platelets: 231 10*3/uL (ref 140–400)
RBC: 4.15 10*6/uL (ref 3.80–5.10)
RDW: 12.5 % (ref 11.0–15.0)
WBC: 4.8 10*3/uL (ref 3.8–10.8)

## 2018-01-31 LAB — VITAMIN D 25 HYDROXY (VIT D DEFICIENCY, FRACTURES): Vit D, 25-Hydroxy: 32 ng/mL (ref 30–100)

## 2018-01-31 LAB — COMPREHENSIVE METABOLIC PANEL
AG Ratio: 1.9 (calc) (ref 1.0–2.5)
ALT: 15 U/L (ref 6–29)
AST: 18 U/L (ref 10–35)
Albumin: 4.3 g/dL (ref 3.6–5.1)
Alkaline phosphatase (APISO): 70 U/L (ref 33–130)
BUN: 19 mg/dL (ref 7–25)
CO2: 24 mmol/L (ref 20–32)
Calcium: 9.3 mg/dL (ref 8.6–10.4)
Chloride: 106 mmol/L (ref 98–110)
Creat: 0.82 mg/dL (ref 0.50–1.05)
GLUCOSE: 84 mg/dL (ref 65–99)
Globulin: 2.3 g/dL (calc) (ref 1.9–3.7)
Potassium: 4.2 mmol/L (ref 3.5–5.3)
Sodium: 140 mmol/L (ref 135–146)
Total Bilirubin: 0.9 mg/dL (ref 0.2–1.2)
Total Protein: 6.6 g/dL (ref 6.1–8.1)

## 2018-01-31 LAB — LIPID PANEL
Cholesterol: 195 mg/dL (ref <200)
HDL: 63 mg/dL (ref >50)
LDL Cholesterol (Calc): 111 mg/dL (calc) — ABNORMAL HIGH
NON-HDL CHOLESTEROL (CALC): 132 mg/dL — AB (ref <130)
Total CHOL/HDL Ratio: 3.1 (calc) (ref <5.0)
Triglycerides: 104 mg/dL (ref <150)

## 2018-01-31 LAB — TSH: TSH: 0.92 mIU/L (ref 0.40–4.50)

## 2018-02-05 ENCOUNTER — Ambulatory Visit
Admission: RE | Admit: 2018-02-05 | Discharge: 2018-02-05 | Disposition: A | Discharge status: Home / Self Care | Payer: BLUE CROSS/BLUE SHIELD | Source: Ambulatory Visit | Attending: Obstetrics & Gynecology | Admitting: Obstetrics & Gynecology

## 2018-02-05 DIAGNOSIS — Z1231 Encounter for screening mammogram for malignant neoplasm of breast: Secondary | ICD-10-CM

## 2018-10-20 ENCOUNTER — Encounter: Payer: Self-pay | Admitting: Gynecology

## 2019-01-01 ENCOUNTER — Other Ambulatory Visit: Payer: Self-pay | Admitting: Obstetrics & Gynecology

## 2019-01-01 DIAGNOSIS — Z1231 Encounter for screening mammogram for malignant neoplasm of breast: Secondary | ICD-10-CM

## 2019-01-04 DIAGNOSIS — H04123 Dry eye syndrome of bilateral lacrimal glands: Secondary | ICD-10-CM | POA: Diagnosis not present

## 2019-01-07 DIAGNOSIS — M79671 Pain in right foot: Secondary | ICD-10-CM | POA: Diagnosis not present

## 2019-01-07 DIAGNOSIS — M722 Plantar fascial fibromatosis: Secondary | ICD-10-CM | POA: Diagnosis not present

## 2019-02-03 ENCOUNTER — Other Ambulatory Visit: Payer: Self-pay

## 2019-02-04 ENCOUNTER — Encounter: Payer: Self-pay | Admitting: Obstetrics & Gynecology

## 2019-02-04 ENCOUNTER — Ambulatory Visit (INDEPENDENT_AMBULATORY_CARE_PROVIDER_SITE_OTHER): Payer: 59 | Admitting: Obstetrics & Gynecology

## 2019-02-04 VITALS — BP 108/70 | Ht 64.75 in | Wt 184.4 lb

## 2019-02-04 DIAGNOSIS — E6609 Other obesity due to excess calories: Secondary | ICD-10-CM | POA: Diagnosis not present

## 2019-02-04 DIAGNOSIS — Z6830 Body mass index (BMI) 30.0-30.9, adult: Secondary | ICD-10-CM

## 2019-02-04 DIAGNOSIS — M8588 Other specified disorders of bone density and structure, other site: Secondary | ICD-10-CM

## 2019-02-04 DIAGNOSIS — Z780 Asymptomatic menopausal state: Secondary | ICD-10-CM

## 2019-02-04 DIAGNOSIS — Z01419 Encounter for gynecological examination (general) (routine) without abnormal findings: Secondary | ICD-10-CM | POA: Diagnosis not present

## 2019-02-04 NOTE — Progress Notes (Signed)
[name] 1960-11-03 [id number]   History:    59 y.o. [id number] Married  RP:  Established patient presenting for annual gyn exam   HPI: Menopause, well on no HRT x 07/2017.  No PMB.  No pelvic pain.  No pain with intercourse.  Urine and bowel movements normal.  Breasts normal.  Body mass index stable at 30.92.  Exercising regularly, walking her daughter's dog.  Will lower the calories and carbs. F/U for Fasting health labs here.   Past medical history,surgical history, family history and social history were all reviewed and documented in the EPIC chart.  Gynecologic History Patient's last menstrual period was 05/01/2010.  Obstetric History OB History  Gravida Para Term Preterm AB Living  4 3 3   1 3   SAB TAB Ectopic Multiple Live Births  1       3    # Outcome Date GA Lbr Len/2nd Weight Sex Delivery Anes PTL Lv  4 SAB           3 Term     M Vag-Spont   LIV  2 Term     F Vag-Spont   LIV  1 Term     F Vag-Spont   LIV     ROS: A ROS was performed and pertinent positives and negatives are included in the history.  GENERAL: No fevers or chills. HEENT: No change in vision, no earache, sore throat or sinus congestion. NECK: No pain or stiffness. CARDIOVASCULAR: No chest pain or pressure. No palpitations. PULMONARY: No shortness of breath, cough or wheeze. GASTROINTESTINAL: No abdominal pain, nausea, vomiting or diarrhea, melena or bright red blood per rectum. GENITOURINARY: No urinary frequency, urgency, hesitancy or dysuria. MUSCULOSKELETAL: No joint or muscle pain, no back pain, no recent trauma. DERMATOLOGIC: No rash, no itching, no lesions. ENDOCRINE: No polyuria, polydipsia, no heat or cold intolerance. No recent change in weight. HEMATOLOGICAL: No anemia or easy bruising or bleeding. NEUROLOGIC: No headache, seizures, numbness, tingling or weakness. PSYCHIATRIC: No depression, no loss of interest in normal activity or change in sleep pattern.     Exam:   BP 108/70   Ht  5' 4.75" (1.645 m)   Wt 184 lb 6.4 oz (83.6 kg)   LMP 05/01/2010   BMI 30.92 kg/m   Body mass index is 30.92 kg/m.  General appearance : Well developed well nourished female. No acute distress HEENT: Eyes: no retinal hemorrhage or exudates,  Neck supple, trachea midline, no carotid bruits, no thyroidmegaly Lungs: Clear to auscultation, no rhonchi or wheezes, or rib retractions  Heart: Regular rate and rhythm, no murmurs or gallops Breast:Examined in sitting and supine position were symmetrical in appearance, no palpable masses or tenderness,  no skin retraction, no nipple inversion, no nipple discharge, no skin discoloration, no axillary or supraclavicular lymphadenopathy Abdomen: no palpable masses or tenderness, no rebound or guarding Extremities: no edema or skin discoloration or tenderness  Pelvic: Vulva: Normal             Vagina: No gross lesions or discharge  Cervix: No gross lesions or discharge.  Pap reflex done.  Uterus  AV, normal size, shape and consistency, non-tender and mobile  Adnexa  Without masses or tenderness  Anus: Normal   Assessment/Plan:  59 y.o. female for annual exam   1. Encounter for routine gynecological examination with Papanicolaou smear of cervix Normal gynecologic exam in menopause.  Pap reflex done.  Breast exam normal.  Screening mammogram January 2020 was negative, will  schedule screening mammogram now.  Colonoscopy in 2015.  Follow-up fasting health labs here. - CBC; Future - Comprehensive metabolic panel; Future - TSH; Future - Lipid panel; Future - VITAMIN D 25 Hydroxy (Vit-D Deficiency, Fractures); Future  2. Postmenopausal Well on no hormone replacement therapy.  No postmenopausal bleeding.  3. Osteopenia of lumbar spine Very mild osteopenia at the lumbar spine with a T score at -1.2.  All other sites normal.  We will repeat a bone density at 3 years.  Recommend vitamin D supplements, calcium intake of 1200 mg daily and regular  weightbearing physical activities.  4. Class 1 obesity due to excess calories without serious comorbidity with body mass index (BMI) of 30.0 to 30.9 in adult Recommend a low calorie/carb diet such as [employer].  Aerobic physical activities 5 times a week and light weightlifting every 2 days.  Other orders - COLLAGEN PO; Take by mouth. - Ascorbic Acid (VITAMIN C) 1000 MG tablet; Take 1,000 mg by mouth daily.  Princess Bruins MD, 9:51 AM 02/04/2019

## 2019-02-05 LAB — PAP IG W/ RFLX HPV ASCU

## 2019-02-06 ENCOUNTER — Encounter: Payer: Self-pay | Admitting: Obstetrics & Gynecology

## 2019-02-06 NOTE — Patient Instructions (Signed)
1. Encounter for routine gynecological examination with Papanicolaou smear of cervix Normal gynecologic exam in menopause.  Pap reflex done.  Breast exam normal.  Screening mammogram January 2020 was negative, will schedule screening mammogram now.  Colonoscopy in 2015.  Follow-up fasting health labs here. - CBC; Future - Comprehensive metabolic panel; Future - TSH; Future - Lipid panel; Future - VITAMIN D 25 Hydroxy (Vit-D Deficiency, Fractures); Future  2. Postmenopausal Well on no hormone replacement therapy.  No postmenopausal bleeding.  3. Osteopenia of lumbar spine Very mild osteopenia at the lumbar spine with a T score at -1.2.  All other sites normal.  We will repeat a bone density at 3 years.  Recommend vitamin D supplements, calcium intake of 1200 mg daily and regular weightbearing physical activities.  4. Class 1 obesity due to excess calories without serious comorbidity with body mass index (BMI) of 30.0 to 30.9 in adult Recommend a low calorie/carb diet such as [employer].  Aerobic physical activities 5 times a week and light weightlifting every 2 days.  Other orders - COLLAGEN PO; Take by mouth. - Ascorbic Acid (VITAMIN C) 1000 MG tablet; Take 1,000 mg by mouth daily.  Baker, it was a pleasure seeing you today!  I will inform you of your results as soon as they are available.

## 2019-02-23 ENCOUNTER — Ambulatory Visit
Admission: RE | Admit: 2019-02-23 | Discharge: 2019-02-23 | Disposition: A | Discharge status: Home / Self Care | Payer: 59 | Source: Ambulatory Visit | Attending: Obstetrics & Gynecology | Admitting: Obstetrics & Gynecology

## 2019-02-23 ENCOUNTER — Other Ambulatory Visit: Payer: Self-pay

## 2019-02-23 DIAGNOSIS — Z1231 Encounter for screening mammogram for malignant neoplasm of breast: Secondary | ICD-10-CM

## 2020-02-08 ENCOUNTER — Encounter: Payer: 59 | Admitting: Obstetrics & Gynecology

## 2020-02-09 ENCOUNTER — Other Ambulatory Visit: Payer: Self-pay | Admitting: Obstetrics & Gynecology

## 2020-02-09 DIAGNOSIS — Z1231 Encounter for screening mammogram for malignant neoplasm of breast: Secondary | ICD-10-CM

## 2020-03-02 ENCOUNTER — Encounter: Payer: Self-pay | Admitting: Obstetrics & Gynecology

## 2020-03-02 ENCOUNTER — Other Ambulatory Visit: Payer: Self-pay

## 2020-03-02 ENCOUNTER — Ambulatory Visit (INDEPENDENT_AMBULATORY_CARE_PROVIDER_SITE_OTHER): Payer: 59 | Admitting: Obstetrics & Gynecology

## 2020-03-02 VITALS — BP 122/78 | Ht 65.0 in | Wt 180.0 lb

## 2020-03-02 DIAGNOSIS — Z01419 Encounter for gynecological examination (general) (routine) without abnormal findings: Secondary | ICD-10-CM | POA: Diagnosis not present

## 2020-03-02 DIAGNOSIS — Z780 Asymptomatic menopausal state: Secondary | ICD-10-CM | POA: Diagnosis not present

## 2020-03-02 DIAGNOSIS — E663 Overweight: Secondary | ICD-10-CM | POA: Diagnosis not present

## 2020-03-02 DIAGNOSIS — M8588 Other specified disorders of bone density and structure, other site: Secondary | ICD-10-CM | POA: Diagnosis not present

## 2020-03-02 NOTE — Progress Notes (Signed)
[name] 26-May-1960 [id number]   History:    60 y.o. [id number] Married  RF:[id number] presenting for annual gyn exam   TJQ:[id number], well on no HRT x 07/2017. No PMB. No pelvic pain. No pain with intercourse. Urine and bowel movements normal. Breasts normal. Body mass index improved at 29.95. Exercising regularly, walking her daughter's dog.  Lowering the calories and carbs. Fasting health labs here today.  Past medical history,surgical history, family history and social history were all reviewed and documented in the EPIC chart.  Gynecologic History Patient's last menstrual period was 05/01/2010.  Obstetric History OB History  Gravida Para Term Preterm AB Living  _0 SAB IAB Ectopic Multiple Live Births  1   0   3    # Outcome Date GA Lbr Len/2nd Weight Sex Delivery Anes PTL Lv  4 SAB           3 Term     M Vag-Spont   LIV  2 Term     F Vag-Spont   LIV  1 Term     F Vag-Spont   LIV     ROS: A ROS was performed and pertinent positives and negatives are included in the history.  GENERAL: No fevers or chills. HEENT: No change in vision, no earache, sore throat or sinus congestion. NECK: No pain or stiffness. CARDIOVASCULAR: No chest pain or pressure. No palpitations. PULMONARY: No shortness of breath, cough or wheeze. GASTROINTESTINAL: No abdominal pain, nausea, vomiting or diarrhea, melena or bright red blood per rectum. GENITOURINARY: No urinary frequency, urgency, hesitancy or dysuria. MUSCULOSKELETAL: No joint or muscle pain, no back pain, no recent trauma. DERMATOLOGIC: No rash, no itching, no lesions. ENDOCRINE: No polyuria, polydipsia, no heat or cold intolerance. No recent change in weight. HEMATOLOGICAL: No anemia or easy bruising or bleeding. NEUROLOGIC: No headache, seizures, numbness, tingling or weakness. PSYCHIATRIC: No depression, no loss of interest in normal activity or change in sleep pattern.     Exam:   BP 122/78 (BP  Location: Right Arm, Patient Position: Sitting, Cuff Size: Normal)   Ht _1  (1.651 m)   Wt 180 lb (81.6 kg)   LMP 05/01/2010   BMI 29.95 kg/m   Body mass index is 29.95 kg/m.  General appearance : Well developed well nourished female. No acute distress HEENT: Eyes: no retinal hemorrhage or exudates,  Neck supple, trachea midline, no carotid bruits, no thyroidmegaly Lungs: Clear to auscultation, no rhonchi or wheezes, or rib retractions  Heart: Regular rate and rhythm, no murmurs or gallops Breast:Examined in sitting and supine position were symmetrical in appearance, no palpable masses or tenderness,  no skin retraction, no nipple inversion, no nipple discharge, no skin discoloration, no axillary or supraclavicular lymphadenopathy Abdomen: no palpable masses or tenderness, no rebound or guarding Extremities: no edema or skin discoloration or tenderness  Pelvic: Vulva: Normal             Vagina: No gross lesions or discharge  Cervix: No gross lesions or discharge  Uterus  AV, normal size, shape and consistency, non-tender and mobile  Adnexa  Without masses or tenderness  Anus: Normal   Assessment/Plan:  60 y.o. female for annual exam   1. Well female exam with routine gynecological exam Normal gynecologic exam in menopause.  No indication for a Pap test this year, will do Paps every 3 yrs.  Breast exam normal.  Screening mammo scheduled 03/2020.  [facility] 2015.  Fasting health  labs here today. - CBC - Comp Met (CMET) - Lipid Profile - TSH - Vitamin D 1,25 dihydroxy  2. Postmenopausal Well on no HRT.  No PMB.  3. Osteopenia of lumbar spine BD with very mild Osteopenia at the AP Spine with a T-Score of -1.2 in 01/2017, all other sites normal.  Will repeat a BD 01/2022.  Vit D supplements, Ca++ 1.5 g/d, regular weightbearing physical activities.  4. Overweight (BMI 25.0-29.9) Continue with lower Calorie/Carb diet and regular fitness activities.  Princess Bruins MD, 10:14 AM  03/02/2020

## 2020-03-07 LAB — LIPID PANEL
Cholesterol: 212 mg/dL — ABNORMAL HIGH (ref <200)
HDL: 63 mg/dL (ref >=50)
LDL Cholesterol (Calc): 124 mg/dL (calc) — ABNORMAL HIGH
Non-HDL Cholesterol (Calc): 149 mg/dL (calc) — ABNORMAL HIGH (ref <130)
Total CHOL/HDL Ratio: 3.4 (calc) (ref <5.0)
Triglycerides: 132 mg/dL (ref <150)

## 2020-03-07 LAB — VITAMIN D 1,25 DIHYDROXY
Vitamin D 1, 25 (OH)2 Total: 67 pg/mL (ref 18–72)
Vitamin D2 1, 25 (OH)2: <8 pg/mL
Vitamin D3 1, 25 (OH)2: 67 pg/mL

## 2020-03-07 LAB — COMPREHENSIVE METABOLIC PANEL
AG Ratio: 1.9 (calc) (ref 1.0–2.5)
ALT: 11 U/L (ref 6–29)
AST: 19 U/L (ref 10–35)
Albumin: 4.6 g/dL (ref 3.6–5.1)
Alkaline phosphatase (APISO): 69 U/L (ref 37–153)
BUN: 18 mg/dL (ref 7–25)
CO2: 27 mmol/L (ref 20–32)
Calcium: 9.4 mg/dL (ref 8.6–10.4)
Chloride: 105 mmol/L (ref 98–110)
Creat: 0.85 mg/dL (ref 0.50–1.05)
Globulin: 2.4 g/dL (calc) (ref 1.9–3.7)
Glucose, Bld: 94 mg/dL (ref 65–99)
Potassium: 4.4 mmol/L (ref 3.5–5.3)
Sodium: 139 mmol/L (ref 135–146)
Total Bilirubin: 0.9 mg/dL (ref 0.2–1.2)
Total Protein: 7 g/dL (ref 6.1–8.1)

## 2020-03-07 LAB — CBC
HCT: 40.5 % (ref 35.0–45.0)
Hemoglobin: 13.5 g/dL (ref 11.7–15.5)
MCH: 31.9 pg (ref 27.0–33.0)
MCHC: 33.3 g/dL (ref 32.0–36.0)
MCV: 95.7 fL (ref 80.0–100.0)
MPV: 9.8 fL (ref 7.5–12.5)
Platelets: 227 10*3/uL (ref 140–400)
RBC: 4.23 10*6/uL (ref 3.80–5.10)
RDW: 12.3 % (ref 11.0–15.0)
WBC: 4.5 10*3/uL (ref 3.8–10.8)

## 2020-03-07 LAB — TSH: TSH: 0.99 mIU/L (ref 0.40–4.50)

## 2020-03-13 ENCOUNTER — Encounter: Payer: Self-pay | Admitting: *Deleted

## 2020-03-22 ENCOUNTER — Other Ambulatory Visit: Payer: Self-pay

## 2020-03-22 ENCOUNTER — Ambulatory Visit
Admission: RE | Admit: 2020-03-22 | Discharge: 2020-03-22 | Disposition: A | Discharge status: Home / Self Care | Payer: 59 | Source: Ambulatory Visit | Attending: Obstetrics & Gynecology | Admitting: Obstetrics & Gynecology

## 2020-03-22 DIAGNOSIS — Z1231 Encounter for screening mammogram for malignant neoplasm of breast: Secondary | ICD-10-CM

## 2021-02-09 ENCOUNTER — Other Ambulatory Visit: Payer: Self-pay | Admitting: Obstetrics & Gynecology

## 2021-02-09 DIAGNOSIS — Z1231 Encounter for screening mammogram for malignant neoplasm of breast: Secondary | ICD-10-CM

## 2021-03-07 ENCOUNTER — Ambulatory Visit (INDEPENDENT_AMBULATORY_CARE_PROVIDER_SITE_OTHER): Payer: 59 | Admitting: Obstetrics & Gynecology

## 2021-03-07 ENCOUNTER — Other Ambulatory Visit: Payer: Self-pay

## 2021-03-07 ENCOUNTER — Encounter: Payer: Self-pay | Admitting: Obstetrics & Gynecology

## 2021-03-07 VITALS — BP 110/60 | HR 76 | Resp 16 | Ht 64.25 in | Wt 184.0 lb

## 2021-03-07 DIAGNOSIS — Z780 Asymptomatic menopausal state: Secondary | ICD-10-CM

## 2021-03-07 DIAGNOSIS — M8588 Other specified disorders of bone density and structure, other site: Secondary | ICD-10-CM

## 2021-03-07 DIAGNOSIS — Z01419 Encounter for gynecological examination (general) (routine) without abnormal findings: Secondary | ICD-10-CM

## 2021-03-07 LAB — COMPREHENSIVE METABOLIC PANEL
AG Ratio: 1.8 (calc) (ref 1.0–2.5)
ALT: 9 U/L (ref 6–29)
AST: 16 U/L (ref 10–35)
Albumin: 4.4 g/dL (ref 3.6–5.1)
Alkaline phosphatase (APISO): 71 U/L (ref 37–153)
BUN: 19 mg/dL (ref 7–25)
CO2: 28 mmol/L (ref 20–32)
Calcium: 9.2 mg/dL (ref 8.6–10.4)
Chloride: 105 mmol/L (ref 98–110)
Creat: 0.87 mg/dL (ref 0.50–1.03)
Globulin: 2.4 g/dL (calc) (ref 1.9–3.7)
Glucose, Bld: 97 mg/dL (ref 65–99)
Potassium: 4.2 mmol/L (ref 3.5–5.3)
Sodium: 139 mmol/L (ref 135–146)
Total Bilirubin: 1 mg/dL (ref 0.2–1.2)
Total Protein: 6.8 g/dL (ref 6.1–8.1)

## 2021-03-07 LAB — CBC
HCT: 40.1 % (ref 35.0–45.0)
Hemoglobin: 13.4 g/dL (ref 11.7–15.5)
MCH: 32.4 pg (ref 27.0–33.0)
MCHC: 33.4 g/dL (ref 32.0–36.0)
MCV: 97.1 fL (ref 80.0–100.0)
MPV: 9.6 fL (ref 7.5–12.5)
Platelets: 246 10*3/uL (ref 140–400)
RBC: 4.13 10*6/uL (ref 3.80–5.10)
RDW: 12.2 % (ref 11.0–15.0)
WBC: 4.8 10*3/uL (ref 3.8–10.8)

## 2021-03-07 LAB — LIPID PANEL
Cholesterol: 221 mg/dL — ABNORMAL HIGH (ref <200)
HDL: 61 mg/dL (ref >=50)
LDL Cholesterol (Calc): 135 mg/dL (calc) — ABNORMAL HIGH
Non-HDL Cholesterol (Calc): 160 mg/dL (calc) — ABNORMAL HIGH (ref <130)
Total CHOL/HDL Ratio: 3.6 (calc) (ref <5.0)
Triglycerides: 124 mg/dL (ref <150)

## 2021-03-07 LAB — TSH: TSH: 0.82 mIU/L (ref 0.40–4.50)

## 2021-03-07 LAB — VITAMIN D 25 HYDROXY (VIT D DEFICIENCY, FRACTURES): Vit D, 25-Hydroxy: 36 ng/mL (ref 30–100)

## 2021-03-07 NOTE — Progress Notes (Signed)
[name] 07-29-1960 [id number]   History:    61 y.o. [id number] Married   RP:  Established patient presenting for annual gyn exam    HPI: Postmenopause, well on no HRT x 07/2017.  No PMB.  No pelvic pain.  No pain with intercourse. Pap Neg 01/2019.  Normal Paps in the past.  Urine and bowel movements normal.  Breasts normal.  Mammo Neg 03/2020.  Body mass index at 31.34. BD 01/2017 Mild Osteopenia only at the AP Spine T-Score -1.2.  Exercising regularly, walking her daughter's dog.  Lowering the calories and carbs. Fasting health labs here today.  [facility] 2015.   Past medical history,surgical history, family history and social history were all reviewed and documented in the EPIC chart.  Gynecologic History Patient's last menstrual period was 05/01/2010.  Obstetric History OB History  Gravida Para Term Preterm AB Living  _0 SAB IAB Ectopic Multiple Live Births  1   0   3    # Outcome Date GA Lbr Len/2nd Weight Sex Delivery Anes PTL Lv  4 SAB           3 Term     M Vag-Spont   LIV  2 Term     F Vag-Spont   LIV  1 Term     F Vag-Spont   LIV     ROS: A ROS was performed and pertinent positives and negatives are included in the history.  GENERAL: No fevers or chills. HEENT: No change in vision, no earache, sore throat or sinus congestion. NECK: No pain or stiffness. CARDIOVASCULAR: No chest pain or pressure. No palpitations. PULMONARY: No shortness of breath, cough or wheeze. GASTROINTESTINAL: No abdominal pain, nausea, vomiting or diarrhea, melena or bright red blood per rectum. GENITOURINARY: No urinary frequency, urgency, hesitancy or dysuria. MUSCULOSKELETAL: No joint or muscle pain, no back pain, no recent trauma. DERMATOLOGIC: No rash, no itching, no lesions. ENDOCRINE: No polyuria, polydipsia, no heat or cold intolerance. No recent change in weight. HEMATOLOGICAL: No anemia or easy bruising or bleeding. NEUROLOGIC: No headache, seizures, numbness, tingling or weakness.  PSYCHIATRIC: No depression, no loss of interest in normal activity or change in sleep pattern.     Exam:   BP 110/60    Pulse 76    Resp 16    Ht 5' 4.25" (1.632 m)    Wt 184 lb (83.5 kg)    LMP 05/01/2010    BMI 31.34 kg/m   Body mass index is 31.34 kg/m.  General appearance : Well developed well nourished female. No acute distress HEENT: Eyes: no retinal hemorrhage or exudates,  Neck supple, trachea midline, no carotid bruits, no thyroidmegaly Lungs: Clear to auscultation, no rhonchi or wheezes, or rib retractions  Heart: Regular rate and rhythm, no murmurs or gallops Breast:Examined in sitting and supine position were symmetrical in appearance, no palpable masses or tenderness,  no skin retraction, no nipple inversion, no nipple discharge, no skin discoloration, no axillary or supraclavicular lymphadenopathy Abdomen: no palpable masses or tenderness, no rebound or guarding Extremities: no edema or skin discoloration or tenderness  Pelvic: Vulva: Normal             Vagina: No gross lesions or discharge  Cervix: No gross lesions or discharge  Uterus  AV, normal size, shape and consistency, non-tender and mobile  Adnexa  Without masses or tenderness  Anus: Normal   Assessment/Plan:  61 y.o. female for annual exam  1. Well female exam with routine gynecological exam Postmenopause, well on no HRT x 07/2017.  No PMB.  No pelvic pain.  No pain with intercourse. Pap Neg 01/2019.  Normal Paps in the past.  Urine and bowel movements normal.  Breasts normal.  Mammo Neg 03/2020.  Body mass index at 31.34. BD 01/2017 Mild Osteopenia only at the AP Spine T-Score -1.2.  Exercising regularly, walking her daughter's dog.  Lowering the calories and carbs. Fasting health labs here today.  [facility] 2015. - CBC - Comp Met (CMET) - TSH - Lipid Profile - Vitamin D (25 hydroxy)  2. Postmenopausal Postmenopause, well on no HRT x 07/2017.  No PMB.  No pelvic pain.  No pain with intercourse.  3.  Osteopenia of lumbar spine  BD 01/2017 Mild Osteopenia only at the AP Spine T-Score -1.2.  Vit D and Ca++.  Exercising regularly, walking her daughter's dog.  Will repeat a BD next year.  Other orders - VITAMIN D PO; Take 5,000 Int'l Units by mouth.   Princess Bruins MD, 9:39 AM 03/07/2021

## 2021-04-05 ENCOUNTER — Ambulatory Visit
Admission: RE | Admit: 2021-04-05 | Discharge: 2021-04-05 | Disposition: A | Discharge status: Home / Self Care | Payer: 59 | Source: Ambulatory Visit | Attending: Obstetrics & Gynecology | Admitting: Obstetrics & Gynecology

## 2022-03-12 ENCOUNTER — Ambulatory Visit: Payer: 59 | Admitting: Obstetrics & Gynecology

## 2022-03-15 ENCOUNTER — Other Ambulatory Visit: Payer: Self-pay | Admitting: Obstetrics & Gynecology

## 2022-03-15 DIAGNOSIS — Z1231 Encounter for screening mammogram for malignant neoplasm of breast: Secondary | ICD-10-CM

## 2022-04-04 ENCOUNTER — Encounter: Payer: Self-pay | Admitting: Obstetrics & Gynecology

## 2022-04-04 ENCOUNTER — Other Ambulatory Visit (HOSPITAL_COMMUNITY)
Admission: RE | Admit: 2022-04-04 | Discharge: 2022-04-04 | Disposition: A | Discharge status: Home / Self Care | Payer: 59 | Source: Ambulatory Visit | Attending: Obstetrics & Gynecology | Admitting: Obstetrics & Gynecology

## 2022-04-04 ENCOUNTER — Ambulatory Visit (INDEPENDENT_AMBULATORY_CARE_PROVIDER_SITE_OTHER): Payer: 59 | Admitting: Obstetrics & Gynecology

## 2022-04-04 VITALS — BP 110/72 | HR 88 | Ht 64.25 in | Wt 185.0 lb

## 2022-04-04 DIAGNOSIS — Z01419 Encounter for gynecological examination (general) (routine) without abnormal findings: Secondary | ICD-10-CM | POA: Insufficient documentation

## 2022-04-04 DIAGNOSIS — Z780 Asymptomatic menopausal state: Secondary | ICD-10-CM | POA: Diagnosis not present

## 2022-04-04 DIAGNOSIS — M8588 Other specified disorders of bone density and structure, other site: Secondary | ICD-10-CM | POA: Diagnosis not present

## 2022-04-04 NOTE — Progress Notes (Signed)
Dannaly Arai Apr 28, 1960 [id number]   History:    62 y.o. [id number] Married   RP:  Established patient presenting for annual gyn exam    HPI: Postmenopause, well on no HRT x 07/2017.  No PMB.  No pelvic pain.  No pain with intercourse. Pap Neg 01/2019.  Normal Paps in the past.  Urine and bowel movements normal.  Breasts normal.  Mammo Neg 03/2021.  Body mass index at 31.51. BD 01/2017 Mild Osteopenia only at the AP Spine T-Score -1.2.  Repeat BD here now. Exercising regularly, walking her daughter's dog.  Lowering the calories and carbs. Fasting health labs here to schedule.  Col[facility] 06/2013.    Past medical history,surgical history, family history and social history were all reviewed and documented in the EPIC chart.  Gynecologic History Patient's last menstrual period was 05/01/2010.  Obstetric History OB History  Gravida Para Term Preterm AB Living  '4 3 3   1 3  '$ SAB IAB Ectopic Multiple Live Births  1   0   3    # Outcome Date GA Lbr Len/2nd Weight Sex Delivery Anes PTL Lv  4 SAB           3 Term     M Vag-Spont   LIV  2 Term     F Vag-Spont   LIV  1 Term     F Vag-Spont   LIV     ROS: A ROS was performed and pertinent positives and negatives are included in the history. GENERAL: No fevers or chills. HEENT: No change in vision, no earache, sore throat or sinus congestion. NECK: No pain or stiffness. CARDIOVASCULAR: No chest pain or pressure. No palpitations. PULMONARY: No shortness of breath, cough or wheeze. GASTROINTESTINAL: No abdominal pain, nausea, vomiting or diarrhea, melena or bright red blood per rectum. GENITOURINARY: No urinary frequency, urgency, hesitancy or dysuria. MUSCULOSKELETAL: No joint or muscle pain, no back pain, no recent trauma. DERMATOLOGIC: No rash, no itching, no lesions. ENDOCRINE: No polyuria, polydipsia, no heat or cold intolerance. No recent change in weight. HEMATOLOGICAL: No anemia or easy bruising or bleeding. NEUROLOGIC: No headache, seizures,  numbness, tingling or weakness. PSYCHIATRIC: No depression, no loss of interest in normal activity or change in sleep pattern.     Exam:   BP 110/72   Pulse 88   Ht 5' 4.25" (1.632 m)   Wt 185 lb (83.9 kg)   LMP 05/01/2010 Comment: not sexually active  SpO2 98%   BMI 31.51 kg/m   Body mass index is 31.51 kg/m.  General appearance : Well developed well nourished female. No acute distress HEENT: Eyes: no retinal hemorrhage or exudates,  Neck supple, trachea midline, no carotid bruits, no thyroidmegaly Lungs: Clear to auscultation, no rhonchi or wheezes, or rib retractions  Heart: Regular rate and rhythm, no murmurs or gallops Breast:Examined in sitting and supine position were symmetrical in appearance, no palpable masses or tenderness,  no skin retraction, no nipple inversion, no nipple discharge, no skin discoloration, no axillary or supraclavicular lymphadenopathy Abdomen: no palpable masses or tenderness, no rebound or guarding Extremities: no edema or skin discoloration or tenderness  Pelvic: Vulva: Normal             Vagina: No gross lesions or discharge  Cervix: No gross lesions or discharge.  Pap reflex done.  Uterus  AV, normal size, shape and consistency, non-tender and mobile  Adnexa  Without masses or tenderness  Anus: Normal   Assessment/Plan:  62 y.o.  female for annual exam   1. Encounter for routine gynecological examination with Papanicolaou smear of cervix Postmenopause, well on no HRT x 07/2017.  No PMB.  No pelvic pain.  No pain with intercourse. Pap Neg 01/2019.  Normal Paps in the past.  Urine and bowel movements normal.  Breasts normal.  Mammo Neg 03/2021.  Body mass index at 31.51. BD 01/2017 Mild Osteopenia only at the AP Spine T-Score -1.2.  Repeat BD here now. Exercising regularly, walking her daughter's dog.  Lowering the calories and carbs. Fasting health labs here to schedule.  [facility] 06/2013. - Cytology - PAP( [place]) - CBC; Future - Comp Met  (CMET); Future - TSH; Future - Lipid Profile; Future - Vitamin D (25 hydroxy); Future  2. Postmenopausal Postmenopause, well on no HRT x 07/2017.  No PMB.  No pelvic pain.  No pain with intercourse.   3. Osteopenia of lumbar spine Body mass index at 31.51. BD 01/2017 Mild Osteopenia only at the AP Spine T-Score -1.2.  Repeat BD here now. Exercising regularly, walking her daughter's dog. Vit D supplement, Ca++ total 1.2-1.5 g/d recommended. - DG Bone Density; Future   Princess Bruins MD, 2:05 PM

## 2022-04-10 LAB — CYTOLOGY - PAP
Comment: NEGATIVE
Diagnosis: NEGATIVE
High risk HPV: NEGATIVE

## 2022-05-01 ENCOUNTER — Ambulatory Visit
Admission: RE | Admit: 2022-05-01 | Discharge: 2022-05-01 | Disposition: A | Discharge status: Home / Self Care | Payer: 59 | Source: Ambulatory Visit | Attending: Obstetrics & Gynecology | Admitting: Obstetrics & Gynecology

## 2022-05-01 DIAGNOSIS — Z1231 Encounter for screening mammogram for malignant neoplasm of breast: Secondary | ICD-10-CM

## 2022-05-14 ENCOUNTER — Other Ambulatory Visit: Payer: 59

## 2022-05-14 ENCOUNTER — Other Ambulatory Visit: Payer: Self-pay | Admitting: Obstetrics & Gynecology

## 2022-05-14 ENCOUNTER — Ambulatory Visit (INDEPENDENT_AMBULATORY_CARE_PROVIDER_SITE_OTHER): Payer: 59

## 2022-05-14 DIAGNOSIS — Z01419 Encounter for gynecological examination (general) (routine) without abnormal findings: Secondary | ICD-10-CM

## 2022-05-14 DIAGNOSIS — Z13820 Encounter for screening for osteoporosis: Secondary | ICD-10-CM | POA: Diagnosis not present

## 2022-05-14 DIAGNOSIS — M8589 Other specified disorders of bone density and structure, multiple sites: Secondary | ICD-10-CM

## 2022-05-14 DIAGNOSIS — M8588 Other specified disorders of bone density and structure, other site: Secondary | ICD-10-CM

## 2022-05-14 DIAGNOSIS — Z780 Asymptomatic menopausal state: Secondary | ICD-10-CM

## 2022-05-15 LAB — CBC
HCT: 37.2 % (ref 35.0–45.0)
Hemoglobin: 12.8 g/dL (ref 11.7–15.5)
MCH: 32.7 pg (ref 27.0–33.0)
MCHC: 34.4 g/dL (ref 32.0–36.0)
MCV: 95.1 fL (ref 80.0–100.0)
MPV: 9.7 fL (ref 7.5–12.5)
Platelets: 234 10*3/uL (ref 140–400)
RBC: 3.91 10*6/uL (ref 3.80–5.10)
RDW: 12.6 % (ref 11.0–15.0)
WBC: 7.2 10*3/uL (ref 3.8–10.8)

## 2022-05-15 LAB — VITAMIN D 25 HYDROXY (VIT D DEFICIENCY, FRACTURES): Vit D, 25-Hydroxy: 34 ng/mL (ref 30–100)

## 2022-05-15 LAB — COMPREHENSIVE METABOLIC PANEL
AG Ratio: 1.9 (calc) (ref 1.0–2.5)
ALT: 8 U/L (ref 6–29)
AST: 16 U/L (ref 10–35)
Albumin: 4.2 g/dL (ref 3.6–5.1)
Alkaline phosphatase (APISO): 68 U/L (ref 37–153)
BUN: 20 mg/dL (ref 7–25)
CO2: 25 mmol/L (ref 20–32)
Calcium: 9 mg/dL (ref 8.6–10.4)
Chloride: 107 mmol/L (ref 98–110)
Creat: 0.81 mg/dL (ref 0.50–1.05)
Globulin: 2.2 g/dL (calc) (ref 1.9–3.7)
Glucose, Bld: 99 mg/dL (ref 65–99)
Potassium: 4.7 mmol/L (ref 3.5–5.3)
Sodium: 139 mmol/L (ref 135–146)
Total Bilirubin: 1 mg/dL (ref 0.2–1.2)
Total Protein: 6.4 g/dL (ref 6.1–8.1)

## 2022-05-15 LAB — LIPID PANEL
Cholesterol: 193 mg/dL (ref <200)
HDL: 64 mg/dL (ref >=50)
LDL Cholesterol (Calc): 110 mg/dL (calc) — ABNORMAL HIGH
Non-HDL Cholesterol (Calc): 129 mg/dL (calc) (ref <130)
Total CHOL/HDL Ratio: 3 (calc) (ref <5.0)
Triglycerides: 99 mg/dL (ref <150)

## 2022-05-15 LAB — TSH: TSH: 0.58 mIU/L (ref 0.40–4.50)

## 2023-03-25 IMAGING — MG MM DIGITAL SCREENING BILAT W/ TOMO AND CAD
8 series · 8 of 24 positions shown · non-contrast
Comparison: Previous exam(s).

CLINICAL DATA: Screening.

EXAM:
DIGITAL SCREENING BILATERAL MAMMOGRAM WITH TOMOSYNTHESIS AND CAD
TECHNIQUE: Bilateral screening digital craniocaudal and mediolateral oblique
mammograms were obtained. Bilateral screening digital breast
tomosynthesis was performed. The images were evaluated with
computer-aided detection.

[R MLO synth-2D]
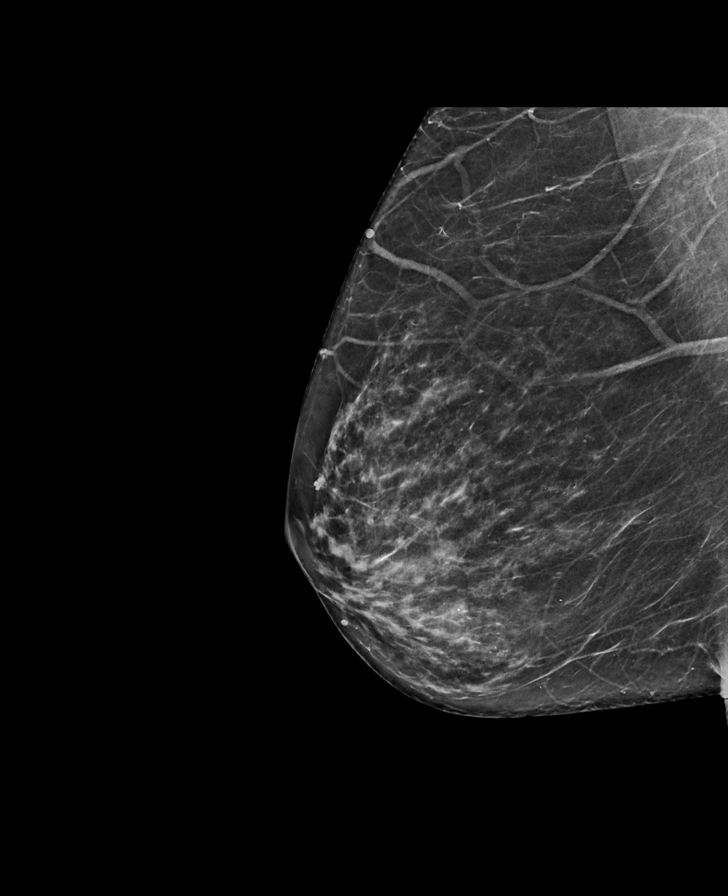

[L CC synth-2D]
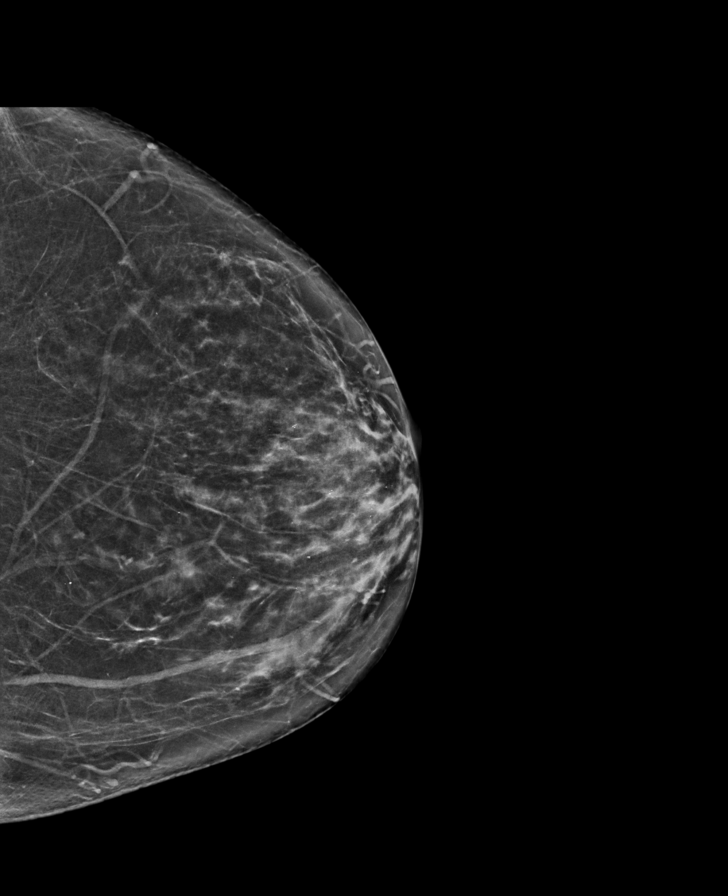

[L MLO synth-2D]
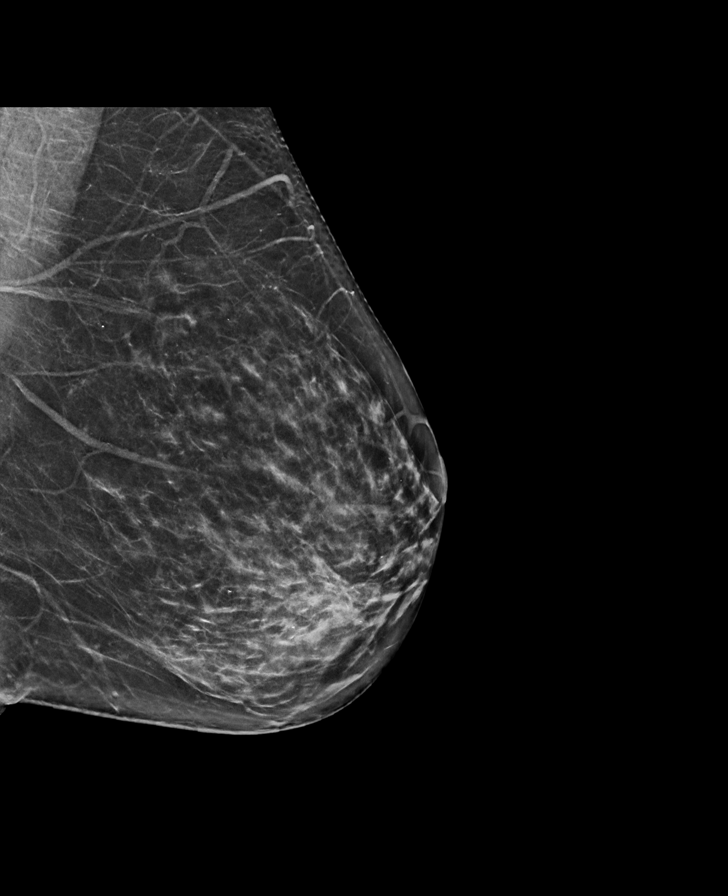

[R CC synth-2D]
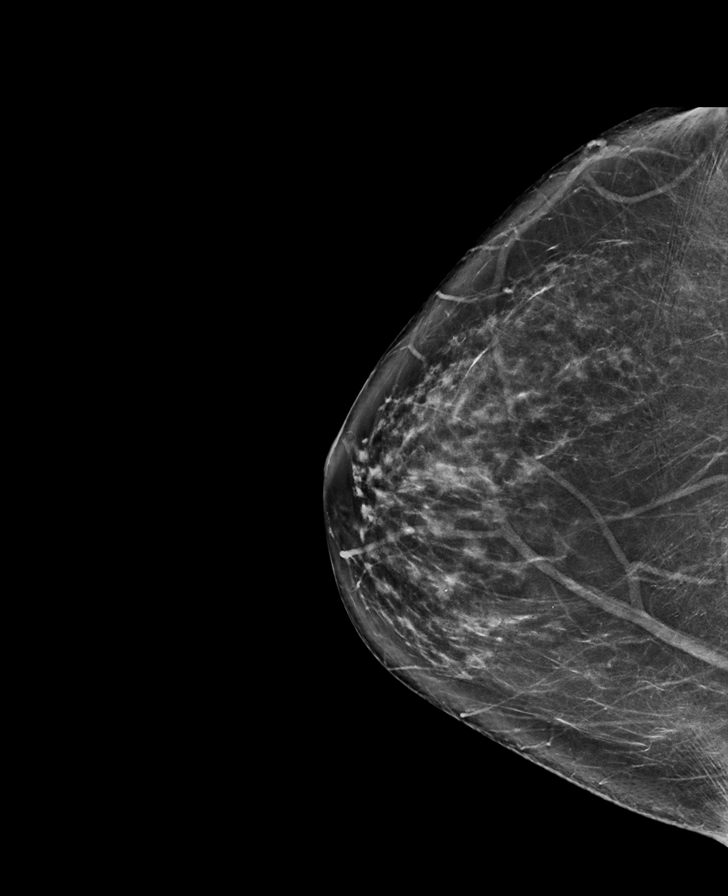

[L CC tomo · tomo slice 34/67.0]
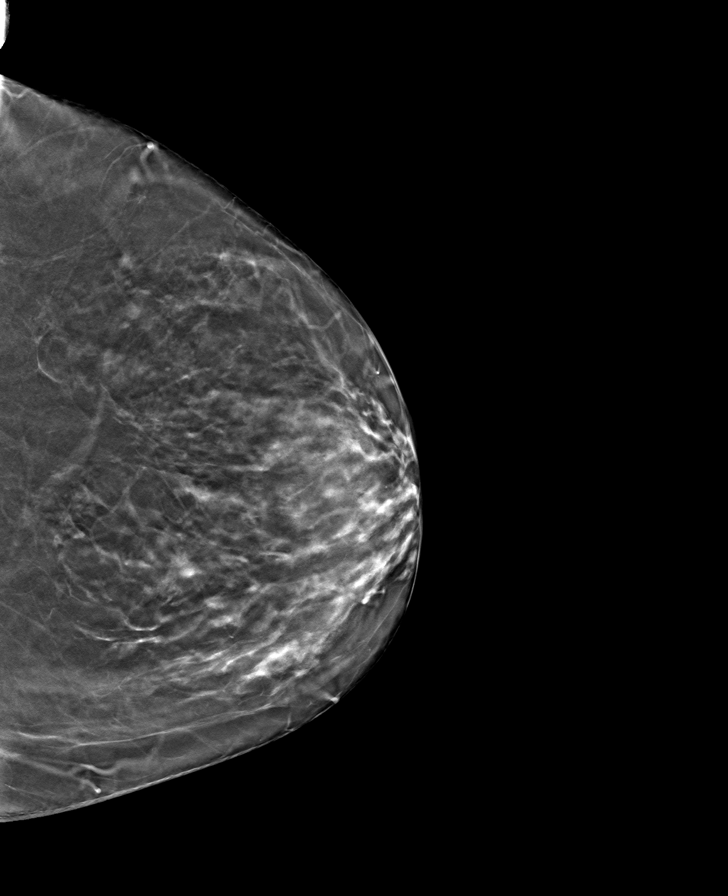

[R MLO tomo · tomo slice 37/72.0]
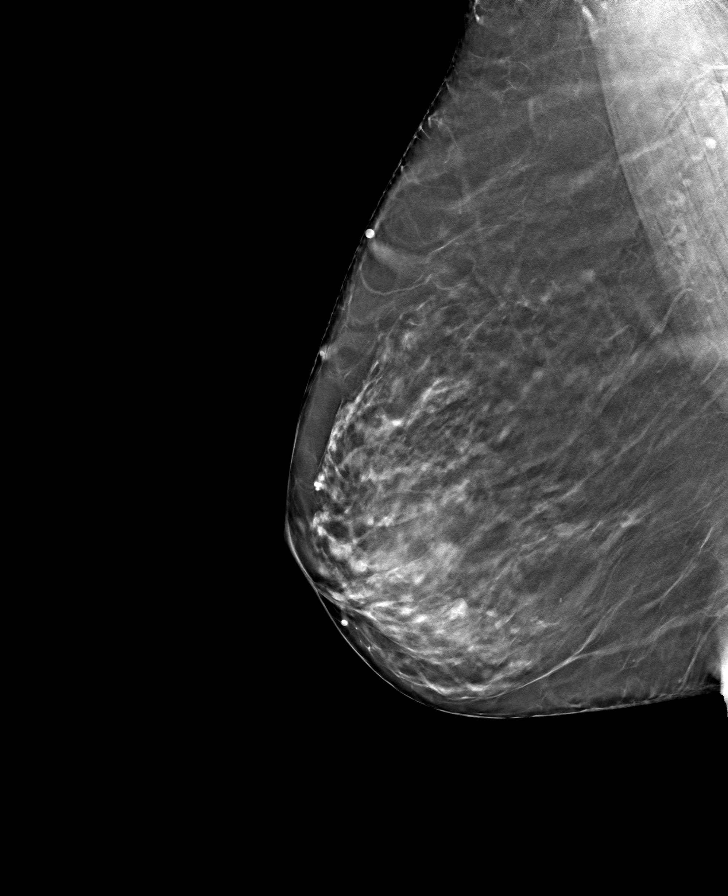

[L MLO tomo · tomo slice 35/70.0]
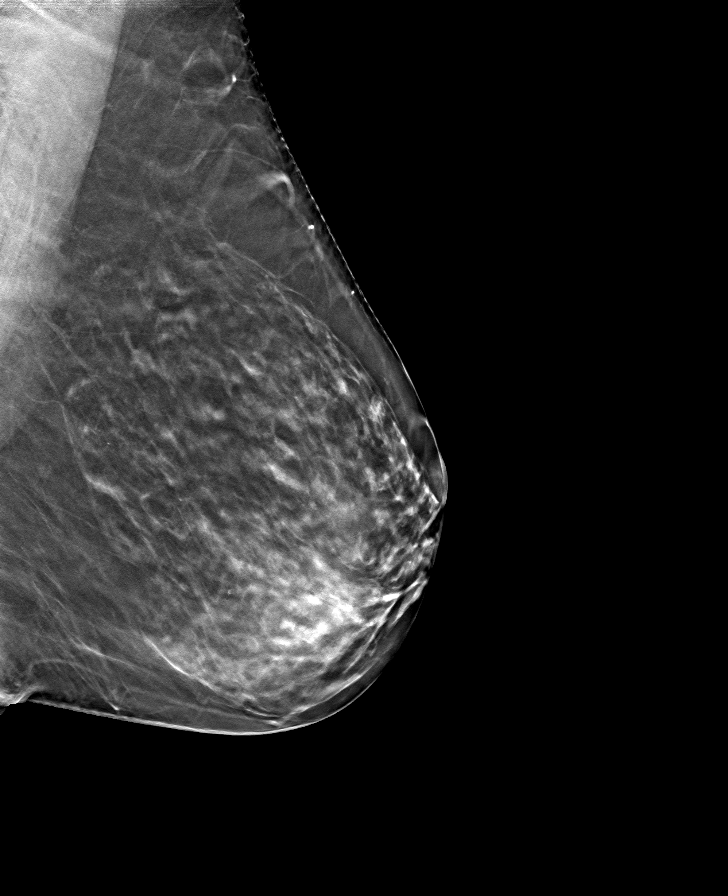

[R CC tomo · tomo slice 39/77.0]
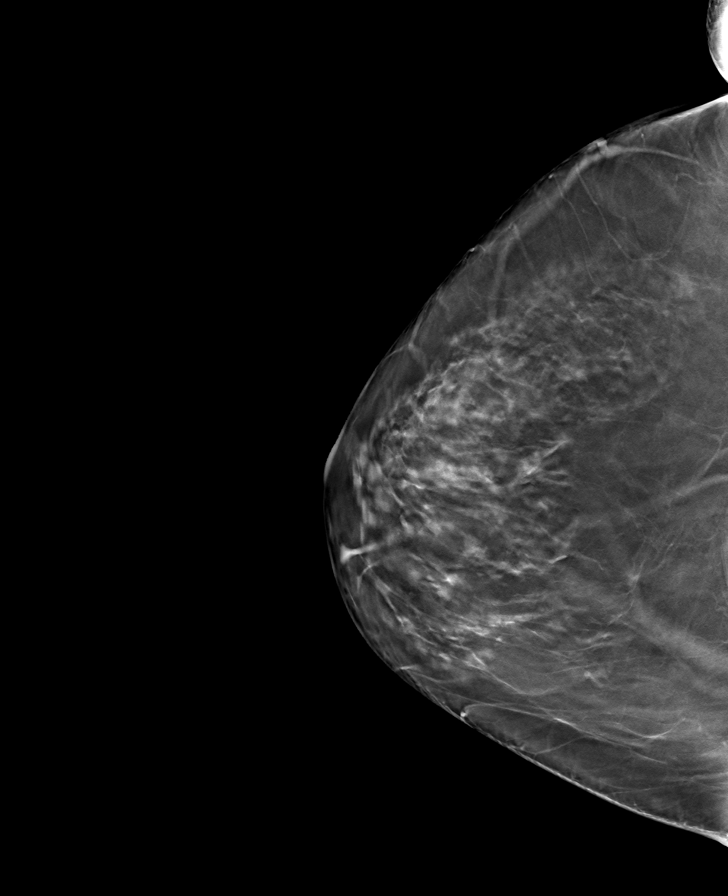

[8 of 24 positions shown; findings below may reference images not displayed]

ACR Breast Density Category c: The breast tissue is heterogeneously
dense, which may obscure small masses.
FINDINGS: There are no findings suspicious for malignancy.
IMPRESSION: No mammographic evidence of malignancy. A result letter of this
screening mammogram will be mailed directly to the patient.

RECOMMENDATION:
Screening mammogram in one year. (Code:[id number]-[id number])

BI-RADS CATEGORY  1: Negative.

## 2023-03-26 ENCOUNTER — Other Ambulatory Visit: Payer: Self-pay | Admitting: Internal Medicine

## 2023-03-26 DIAGNOSIS — Z1231 Encounter for screening mammogram for malignant neoplasm of breast: Secondary | ICD-10-CM

## 2023-05-05 ENCOUNTER — Ambulatory Visit
Admission: RE | Admit: 2023-05-05 | Discharge: 2023-05-05 | Disposition: A | Discharge status: Home / Self Care | Source: Ambulatory Visit | Attending: Internal Medicine

## 2023-05-05 DIAGNOSIS — Z1231 Encounter for screening mammogram for malignant neoplasm of breast: Secondary | ICD-10-CM

## 2023-11-20 ENCOUNTER — Other Ambulatory Visit (HOSPITAL_BASED_OUTPATIENT_CLINIC_OR_DEPARTMENT_OTHER): Payer: Self-pay

## 2023-11-20 MED ORDER — FLUZONE 0.5 ML IM SUSY
0.5000 mL | PREFILLED_SYRINGE | Freq: Once | INTRAMUSCULAR | 0 refills | Status: AC
  Filled 2023-11-20: qty 0.5, 1d supply, fill #0
